# Patient Record
Sex: Female | Born: 1965 | Race: White | Hispanic: No | Smoking: Current every day smoker
Health system: Southern US, Community
[De-identification: ages and names within clinical notes are randomized; demographics above are authoritative.]

## PROBLEM LIST (undated history)

## (undated) DIAGNOSIS — M1990 Unspecified osteoarthritis, unspecified site: Secondary | ICD-10-CM

## (undated) DIAGNOSIS — I10 Essential (primary) hypertension: Secondary | ICD-10-CM

## (undated) DIAGNOSIS — F32A Depression, unspecified: Secondary | ICD-10-CM

## (undated) DIAGNOSIS — E079 Disorder of thyroid, unspecified: Secondary | ICD-10-CM

## (undated) DIAGNOSIS — J45909 Unspecified asthma, uncomplicated: Secondary | ICD-10-CM

## (undated) DIAGNOSIS — R296 Repeated falls: Secondary | ICD-10-CM

## (undated) DIAGNOSIS — G450 Vertebro-basilar artery syndrome: Secondary | ICD-10-CM

## (undated) DIAGNOSIS — I639 Cerebral infarction, unspecified: Secondary | ICD-10-CM

## (undated) DIAGNOSIS — E039 Hypothyroidism, unspecified: Secondary | ICD-10-CM

## (undated) DIAGNOSIS — F419 Anxiety disorder, unspecified: Secondary | ICD-10-CM

## (undated) DIAGNOSIS — J449 Chronic obstructive pulmonary disease, unspecified: Secondary | ICD-10-CM

## (undated) DIAGNOSIS — F17200 Nicotine dependence, unspecified, uncomplicated: Secondary | ICD-10-CM

## (undated) HISTORY — PX: TONSILLECTOMY: SUR1361

## (undated) HISTORY — DX: Nicotine dependence, unspecified, uncomplicated: F17.200

## (undated) HISTORY — PX: SHOULDER ARTHROSCOPY W/ ACROMIAL REPAIR: SUR94

## (undated) HISTORY — DX: Anxiety disorder, unspecified: F41.9

## (undated) HISTORY — PX: OTHER SURGICAL HISTORY: SHX169

## (undated) HISTORY — PX: ABDOMINAL HYSTERECTOMY: SHX81

## (undated) HISTORY — PX: KNEE SURGERY: SHX244

## (undated) HISTORY — DX: Depression, unspecified: F32.A

---

## 2008-04-13 ENCOUNTER — Emergency Department: Payer: Self-pay | Admitting: Emergency Medicine

## 2008-08-18 ENCOUNTER — Ambulatory Visit: Payer: Self-pay | Admitting: Nurse Practitioner

## 2009-04-24 ENCOUNTER — Ambulatory Visit: Payer: Self-pay | Admitting: Internal Medicine

## 2009-06-18 ENCOUNTER — Ambulatory Visit: Payer: Self-pay | Admitting: Otolaryngology

## 2009-12-26 ENCOUNTER — Ambulatory Visit: Payer: Self-pay | Admitting: Unknown Physician Specialty

## 2010-08-03 ENCOUNTER — Ambulatory Visit: Payer: Self-pay | Admitting: Family Medicine

## 2011-08-05 ENCOUNTER — Emergency Department: Payer: Self-pay | Admitting: Unknown Physician Specialty

## 2011-08-05 LAB — CBC
HCT: 34.3 % — ABNORMAL LOW (ref 35.0–47.0)
HGB: 11.5 g/dL — ABNORMAL LOW (ref 12.0–16.0)
MCH: 28.8 pg (ref 26.0–34.0)
MCHC: 33.6 g/dL (ref 32.0–36.0)
Platelet: 162 10*3/uL (ref 150–440)
RBC: 3.99 10*6/uL (ref 3.80–5.20)
WBC: 10.5 10*3/uL (ref 3.6–11.0)

## 2011-08-05 LAB — COMPREHENSIVE METABOLIC PANEL
Chloride: 108 mmol/L — ABNORMAL HIGH (ref 98–107)
Co2: 23 mmol/L (ref 21–32)
EGFR (Non-African Amer.): >60
SGOT(AST): 16 U/L (ref 15–37)
SGPT (ALT): 30 U/L
Sodium: 142 mmol/L (ref 136–145)

## 2011-08-05 LAB — TROPONIN I: Troponin-I: <0.02 ng/mL

## 2012-03-03 ENCOUNTER — Ambulatory Visit: Payer: Self-pay | Admitting: Internal Medicine

## 2013-11-26 ENCOUNTER — Ambulatory Visit: Payer: Self-pay | Admitting: Family Medicine

## 2013-11-26 LAB — RAPID STREP-A WITH REFLX: Micro Text Report: NEGATIVE

## 2013-11-29 LAB — BETA STREP CULTURE(ARMC)

## 2014-12-08 DIAGNOSIS — G450 Vertebro-basilar artery syndrome: Secondary | ICD-10-CM | POA: Insufficient documentation

## 2015-09-30 ENCOUNTER — Encounter: Payer: Self-pay | Admitting: Emergency Medicine

## 2015-09-30 ENCOUNTER — Emergency Department: Payer: Managed Care, Other (non HMO)

## 2015-09-30 ENCOUNTER — Emergency Department
Admission: EM | Admit: 2015-09-30 | Discharge: 2015-09-30 | Disposition: A | Discharge status: Home / Self Care | Payer: Managed Care, Other (non HMO) | Attending: Emergency Medicine | Admitting: Emergency Medicine

## 2015-09-30 ENCOUNTER — Ambulatory Visit
Admission: EM | Admit: 2015-09-30 | Discharge: 2015-09-30 | Disposition: A | Discharge status: Home / Self Care | Payer: Managed Care, Other (non HMO) | Attending: Family Medicine | Admitting: Family Medicine

## 2015-09-30 ENCOUNTER — Ambulatory Visit (INDEPENDENT_AMBULATORY_CARE_PROVIDER_SITE_OTHER): Payer: Managed Care, Other (non HMO)

## 2015-09-30 DIAGNOSIS — Z7951 Long term (current) use of inhaled steroids: Secondary | ICD-10-CM | POA: Insufficient documentation

## 2015-09-30 DIAGNOSIS — R0781 Pleurodynia: Secondary | ICD-10-CM

## 2015-09-30 DIAGNOSIS — J9811 Atelectasis: Secondary | ICD-10-CM | POA: Diagnosis not present

## 2015-09-30 DIAGNOSIS — M79661 Pain in right lower leg: Secondary | ICD-10-CM | POA: Diagnosis not present

## 2015-09-30 DIAGNOSIS — M62838 Other muscle spasm: Secondary | ICD-10-CM | POA: Insufficient documentation

## 2015-09-30 DIAGNOSIS — R091 Pleurisy: Secondary | ICD-10-CM | POA: Diagnosis not present

## 2015-09-30 DIAGNOSIS — Z87891 Personal history of nicotine dependence: Secondary | ICD-10-CM | POA: Diagnosis not present

## 2015-09-30 DIAGNOSIS — J189 Pneumonia, unspecified organism: Secondary | ICD-10-CM | POA: Diagnosis not present

## 2015-09-30 DIAGNOSIS — J45909 Unspecified asthma, uncomplicated: Secondary | ICD-10-CM | POA: Diagnosis not present

## 2015-09-30 DIAGNOSIS — R0602 Shortness of breath: Secondary | ICD-10-CM | POA: Diagnosis present

## 2015-09-30 HISTORY — DX: Unspecified asthma, uncomplicated: J45.909

## 2015-09-30 HISTORY — DX: Disorder of thyroid, unspecified: E07.9

## 2015-09-30 LAB — COMPREHENSIVE METABOLIC PANEL
ALK PHOS: 59 U/L (ref 38–126)
ALT: 30 U/L (ref 14–54)
ANION GAP: 6 (ref 5–15)
AST: 19 U/L (ref 15–41)
Albumin: 4.4 g/dL (ref 3.5–5.0)
BILIRUBIN TOTAL: 0.6 mg/dL (ref 0.3–1.2)
BUN: 23 mg/dL — ABNORMAL HIGH (ref 6–20)
CALCIUM: 9 mg/dL (ref 8.9–10.3)
CO2: 23 mmol/L (ref 22–32)
Chloride: 106 mmol/L (ref 101–111)
Creatinine, Ser: 0.78 mg/dL (ref 0.44–1.00)
Glucose, Bld: 94 mg/dL (ref 65–99)
Potassium: 3.5 mmol/L (ref 3.5–5.1)
Sodium: 135 mmol/L (ref 135–145)
TOTAL PROTEIN: 7.4 g/dL (ref 6.5–8.1)

## 2015-09-30 LAB — TROPONIN I

## 2015-09-30 LAB — CBC
HEMATOCRIT: 40.1 % (ref 35.0–47.0)
HEMOGLOBIN: 13.6 g/dL (ref 12.0–16.0)
MCH: 31 pg (ref 26.0–34.0)
MCHC: 33.9 g/dL (ref 32.0–36.0)
MCV: 91.6 fL (ref 80.0–100.0)
Platelets: 155 10*3/uL (ref 150–440)
RBC: 4.38 MIL/uL (ref 3.80–5.20)
RDW: 12.9 % (ref 11.5–14.5)
WBC: 12.5 10*3/uL — ABNORMAL HIGH (ref 3.6–11.0)

## 2015-09-30 MED ORDER — CARISOPRODOL 350 MG PO TABS: 350.0000 mg | ORAL_TABLET | Freq: Three times a day (TID) | ORAL | Status: DC | PRN

## 2015-09-30 MED ORDER — DOXYCYCLINE HYCLATE 100 MG PO TABS
100.0000 mg | ORAL_TABLET | Freq: Once | ORAL | Status: AC
  Administered 2015-09-30: 100 mg via ORAL
  Filled 2015-09-30: qty 1

## 2015-09-30 MED ORDER — IOPAMIDOL (ISOVUE-370) INJECTION 76%
100.0000 mL | Freq: Once | INTRAVENOUS | Status: AC | PRN
  Administered 2015-09-30: 100 mL via INTRAVENOUS

## 2015-09-30 MED ORDER — KETOROLAC TROMETHAMINE 30 MG/ML IJ SOLN
30.0000 mg | Freq: Once | INTRAMUSCULAR | Status: AC
  Administered 2015-09-30: 30 mg via INTRAVENOUS
  Filled 2015-09-30: qty 1

## 2015-09-30 MED ORDER — DOXYCYCLINE HYCLATE 100 MG PO CAPS
100.0000 mg | ORAL_CAPSULE | Freq: Two times a day (BID) | ORAL | Status: DC
Start: 2015-09-30 — End: 2019-03-17

## 2015-09-30 NOTE — ED Provider Notes (Signed)
[facility] Emergency Department Provider Note  ____________________________________________  Time seen: Approximately 1020 PM  I have reviewed the triage vital signs and the nursing notes.   HISTORY  Chief Complaint Shortness of Breath    HPI Cindy Grant is a 50 y.o. female with a history of asthma and thyroid disease was presenting to the emergency department today with 3 days of left-sided chest pain which she describes as a spasm as well as shortness of breath. She denies any cough or fever. Says that she was also having pain 3 days ago behind her right knee which is now resolved. She says the pain felt like a spasm and is severe and intermittent. It is lasting anywhere from 30 seconds to 1 minute at a time. It is worsened with deep breaths. She was seen in urgent care earlier today and sent in to the emergency department for evaluation for PE. She is not taken any medication for pain. She denies any heavy lifting or any possibility she could've injured herself.She said that she had flu about a month ago but this has since resolved.   Past Medical History  Diagnosis Date  . Asthma   . Thyroid disease     There are no active problems to display for this patient.   Past Surgical History  Procedure Laterality Date  . Abdominal hysterectomy    . Tonsillectomy      Current Outpatient Rx  Name  Route  Sig  Dispense  Refill  . fluticasone-salmeterol (ADVAIR HFA) 115-21 MCG/ACT inhaler   Inhalation   Inhale 2 puffs into the lungs 2 (two) times daily.         Marland Kitchen levothyroxine (SYNTHROID, LEVOTHROID) 150 MCG tablet   Oral   Take 150 mcg by mouth daily before breakfast.         . PARoxetine (PAXIL) 40 MG tablet   Oral   Take 40 mg by mouth every morning.           Allergies Hydrocodone; Oxycodone; and Penicillins  No family history on file.  Social History Social History  Substance Use Topics  . Smoking status: Former Games developer  .  Smokeless tobacco: None  . Alcohol Use: Yes    Review of Systems Constitutional: No fever/chills Eyes: No visual changes. ENT: No sore throat. Cardiovascular: As above Respiratory: As above Gastrointestinal: No abdominal pain.  No nausea, no vomiting.  No diarrhea.  No constipation. Genitourinary: Negative for dysuria. Musculoskeletal: Negative for back pain. Skin: Negative for rash. Neurological: Negative for headaches, focal weakness or numbness.  10-point ROS otherwise negative.  ____________________________________________   PHYSICAL EXAM:  VITAL SIGNS: ED Triage Vitals  Enc Vitals Group     BP 09/30/15 2033 130/62 mmHg     Pulse Rate 09/30/15 2211 73     Resp 09/30/15 2033 20     Temp 09/30/15 2049 98.4 F (36.9 C)     Temp Source 09/30/15 2049 Oral     SpO2 09/30/15 2033 97 %     Weight 09/30/15 2033 230 lb (104.327 kg)     Height --      Head Cir --      Peak Flow --      Pain Score 09/30/15 2030 8     Pain Loc --      Pain Edu? --      Excl. in GC? --     Constitutional: Alert and oriented. Well appearing and in no acute distress.In contrast  to the nursing notes from triage, the patient is not obviously short of breath. However, she does of intermittent spasms which cause her to have heaving respirations. In between the spasms to the left thorax she is calm, relaxed and speaks in full sentences without any respiratory distress. Eyes: Conjunctivae are normal. PERRL. EOMI. Head: Atraumatic. Nose: No congestion/rhinnorhea. Mouth/Throat: Mucous membranes are moist.   Neck: No stridor.   Cardiovascular: Normal rate, regular rhythm. Grossly normal heart sounds.  Good peripheral circulation. Mild tenderness to palpation to left lower lateral thorax. No deformity. No crepitus. Respiratory: Normal respiratory effort.  No retractions. Lungs CTAB. Gastrointestinal: Soft and nontender. No distention.  Musculoskeletal: No lower extremity tenderness nor edema.  No joint  effusions. No tenderness to palpation or masses or ropelike structures to the popliteal fossa bilaterally. Neurologic:  Normal speech and language. No gross focal neurologic deficits are appreciated. No gait instability. Skin:  Skin is warm, dry and intact. No rash noted. Psychiatric: Mood and affect are normal. Speech and behavior are normal.  ____________________________________________   LABS (all labs ordered are listed, but only abnormal results are displayed)  Labs Reviewed  CBC - Abnormal; Notable for the following:    WBC 12.5 (*)    All other components within normal limits  COMPREHENSIVE METABOLIC PANEL - Abnormal; Notable for the following:    BUN 23 (*)    All other components within normal limits  TROPONIN I   ____________________________________________  EKG  ED ECG REPORT I, Arelia LongestSchaevitz,  Cyndie Woodbeck M, the attending physician, personally viewed and interpreted this ECG.   Date: 09/30/2015  EKG Time: 2043  Rate: 69  Rhythm: normal sinus rhythm  Axis: Normal  Intervals: Normal  ST&T Change: No ST segment elevation or depression. No abnormal T-wave inversions. Nonspecific T-wave abnormality likely read by machine because of poor baseline in 1, 3 and aVL.  ____________________________________________  RADIOLOGY      CT Angio Chest PE W/Cm &/Or Wo Cm (Final result) Result time: 09/30/15 22:14:21   Final result by Rad Results In Interface (09/30/15 22:14:21)   Narrative:   CLINICAL DATA: Left-sided pleuritic chest pain and shortness of breath.  EXAM: CT ANGIOGRAPHY CHEST WITH CONTRAST  TECHNIQUE: Multidetector CT imaging of the chest was performed using the standard protocol during bolus administration of intravenous contrast. Multiplanar CT image reconstructions and MIPs were obtained to evaluate the vascular anatomy.  CONTRAST: 100 mL Isovue 370 IV  COMPARISON: Chest x-ray today.  FINDINGS: The pulmonary arteries are well opacified. There is no  evidence of pulmonary embolism. The thoracic aorta is also well opacified and shows normal caliber without evidence of aneurysm or dissection.  Parenchymal opacity at the left lung base present. This most likely represents atelectasis. Early infiltrate is not excluded. There is associated small left pleural effusion. No pulmonary edema, nodules or pneumothorax. No lymphadenopathy identified. The heart size is normal. No pericardial effusion. Visualized upper abdomen shows diffuse steatosis of the liver.  Review of the MIP images confirms the above findings.  IMPRESSION: 1. No evidence of pulmonary embolism. 2. Left lower lobe atelectasis versus early infiltrate with associated small left pleural effusion. 3. Diffuse hepatic steatosis.   Electronically Signed By: Irish LackGlenn Yamagata M.D. On: 09/30/2015 22:14    ____________________________________________   PROCEDURES  ____________________________________________   INITIAL IMPRESSION / ASSESSMENT AND PLAN / ED COURSE  Pertinent labs & imaging results that were available during my care of the patient were reviewed by me and considered in my medical decision making (  see chart for details).  ----------------------------------------- 10:58 PM on 09/30/2015 -----------------------------------------  We'll give the patient dose of Toradol prior to discharge.  After seeing patient clinically she is having chest wall spasm causing the majority of her pain. However, does have left lower lobe atelectasis versus early infiltrate with a small pleural effusion. We'll treat for pneumonia. Discussed the lab results as well as imaging with the patient. She understands the plan is going to comply. She'll also be trying muscle cream such as Aspercreme or icy hot at home for pain relief. ____________________________________________   FINAL CLINICAL IMPRESSION(S) / ED DIAGNOSES  Chest wall pain. Pleurisy. Left lower lobe CAP.     Myrna Blazer, MD 09/30/15 [phone]

## 2015-09-30 NOTE — ED Notes (Signed)
Patient c/o left sided rib pain for the past 3 days.  Patient reports pain in worse with walking and with movement.  Patient reports pain is worse when she takes a deep breath.

## 2015-09-30 NOTE — ED Notes (Signed)
Patient reports shortness of breath and left sided rib pain x3 days. Patient visibly short of breath with talking. Patient was sent from acute care for r/o PE. Patient denies any recent travel, former but not current smoker. States she developed pain to back of right knee a few days ago that was severe and throbbing. Patient ambulatory to stat desk, but obviously short of breath.

## 2015-09-30 NOTE — ED Provider Notes (Signed)
CSN: [id number]     Arrival date & time 09/30/15  1641 History   First MD Initiated Contact with Patient 09/30/15 1851     Chief Complaint  Patient presents with  . Rib Pain    (Consider location/radiation/quality/duration/timing/severity/associated sxs/prior Treatment) HPI Comments: 50 yo female with a 3 days h/o left sided lower chest/rib pain worse with deep breaths. Patient denies any trauma, injuries, falls, fevers, chills, shortness of breath. Also reports about 4 days of right lower leg pain in the upper calf and behind the knee area. Denies any trauma or injury to her right leg. Patient denies rash, recent surgeries, prolonged immobilization, hormonal treatment, or known family h/o blood clots or PE.   The history is provided by the patient.    Past Medical History  Diagnosis Date  . Asthma   . Thyroid disease    Past Surgical History  Procedure Laterality Date  . Abdominal hysterectomy    . Tonsillectomy     History reviewed. No pertinent family history. Social History  Substance Use Topics  . Smoking status: Former Games developer  . Smokeless tobacco: None  . Alcohol Use: Yes   OB History    No data available     Review of Systems  Allergies  Hydrocodone; Oxycodone; and Penicillins  Home Medications   Prior to Admission medications   Medication Sig Start Date End Date Taking? Authorizing Provider  fluticasone-salmeterol (ADVAIR HFA) 115-21 MCG/ACT inhaler Inhale 2 puffs into the lungs 2 (two) times daily.   Yes Historical Provider, MD  levothyroxine (SYNTHROID, LEVOTHROID) 150 MCG tablet Take 150 mcg by mouth daily before breakfast.   Yes Historical Provider, MD  PARoxetine (PAXIL) 40 MG tablet Take 40 mg by mouth every morning.   Yes Historical Provider, MD   Meds Ordered and Administered this Visit  Medications - No data to display  BP 131/63 mmHg  Pulse 81  Temp(Src) 97.5 F (36.4 C) (Tympanic)  Resp 17  Ht  (1.651 m)  Wt 230 lb (104.327 kg)  BMI  38.27 kg/m2  SpO2 100% No data found.   Physical Exam  Constitutional: She appears well-developed and well-nourished. No distress.  Neck: Normal range of motion. Neck supple. No JVD present. No tracheal deviation present.  Cardiovascular: Normal rate, regular rhythm, normal heart sounds and intact distal pulses.   No murmur heard. Pulmonary/Chest: Effort normal and breath sounds normal. No stridor. No respiratory distress. She has no wheezes. She has no rales. She exhibits no tenderness.  Musculoskeletal: She exhibits tenderness. She exhibits no edema.       Right lower leg: She exhibits tenderness (to right upper calf area).  Neurological: She is alert.  Skin: She is not diaphoretic.  Nursing note and vitals reviewed.   ED Course  Procedures (including critical care time)  Labs Review Labs Reviewed - No data to display  Imaging Review Dg Ribs Unilateral W/chest Left  09/30/2015  CLINICAL DATA:  Left-sided rib pain for 3 days. EXAM: LEFT RIBS AND CHEST - 3+ VIEW COMPARISON:  Chest x-ray dated 08/05/2011 FINDINGS: No fracture or other bone lesions are seen involving the ribs. Small focal area of atelectasis at the left base laterally with a tiny left effusion. Heart size and pulmonary vascularity are normal. Right lung is clear. IMPRESSION: Focal atelectasis and small effusion at the left lung base. The ribs appear normal. Electronically Signed   By: Francene Boyers M.D.   On: 09/30/2015 19:22     Visual Acuity Review  Right Eye Distance:   Left Eye Distance:   Bilateral Distance:    Right Eye Near:   Left Eye Near:    Bilateral Near:         MDM   1. Pleuritic chest pain   2. Atelectasis   3. Right calf pain      1. x-ray results and possible etiology/diagnosis reviewed with patient; recommend patient go to ED for further evaluation (for possible PE) and management; patient verbalizes understanding; patient in stable condition; [facility] in  [facility]; [facility]            Discharge Medication List as of 09/30/2015  8:04 PM       Payton Mccallumrlando Cecille Mcclusky, MD 09/30/15 2018

## 2015-09-30 NOTE — ED Notes (Addendum)
Pt reports spasm in left rib cage with deep breaths - Spasm/pain radiates into mid sternal area - Pt denies vomiting but reports nausea with the spasms - pt went to urgent care today and they sent her to the er stating that they thought she might have a PE

## 2015-09-30 NOTE — Discharge Instructions (Signed)

## 2017-04-10 IMAGING — CT CT ANGIO CHEST
2 of 6 series · 18 of 36 positions shown · IV contrast (APPLIED)
Comparison: Chest x-ray today.

CLINICAL DATA: Left-sided pleuritic chest pain and shortness of
breath.

EXAM:
CT ANGIOGRAPHY CHEST WITH CONTRAST
TECHNIQUE: Multidetector CT imaging of the chest was performed using the
standard protocol during bolus administration of intravenous
contrast. Multiplanar CT image reconstructions and MIPs were
obtained to evaluate the vascular anatomy.
CONTRAST:  100 mL Isovue 370 IV

[Series 5: pe 1.0 thins · axial · 0.70mm/px · z∈[-303,-73]mm · 17 of 260 slices shown]
[im 15/260  lung]
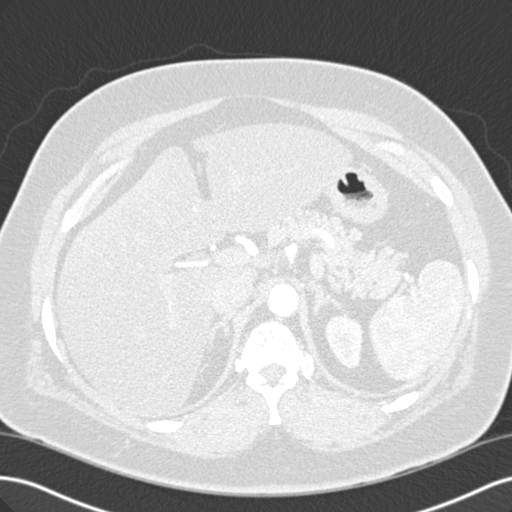
[im 29/260  mediastinal]
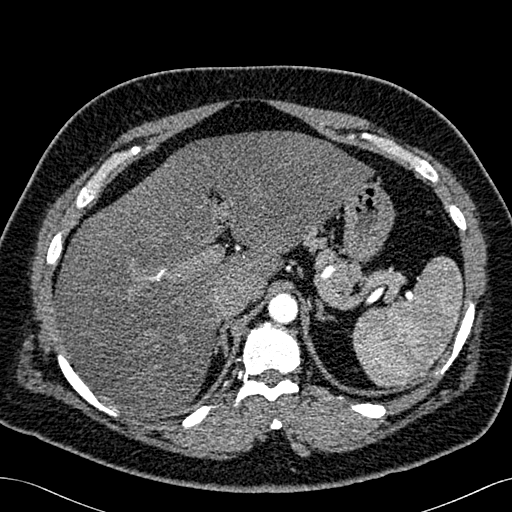
[im 44/260  lung]
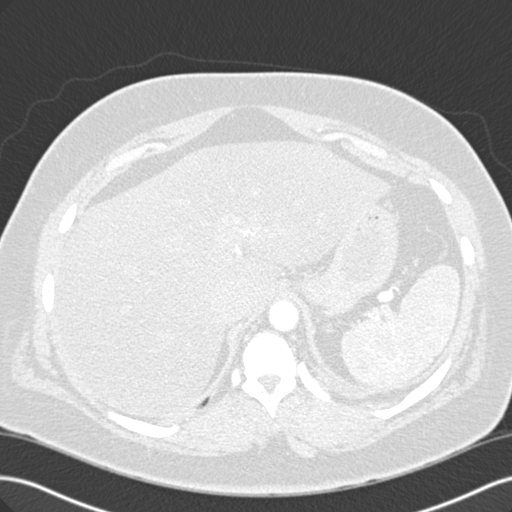
[im 58/260  mediastinal]
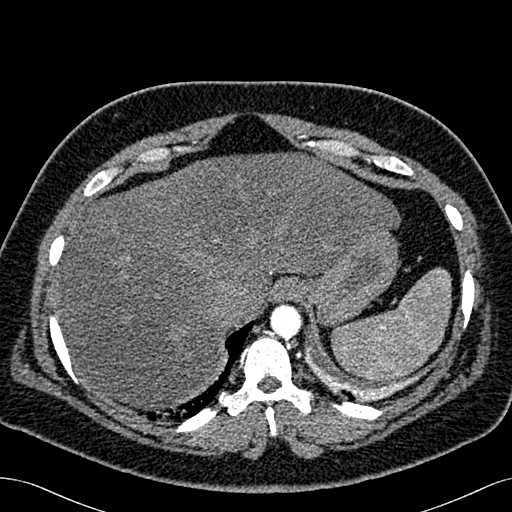
[im 72/260  lung]
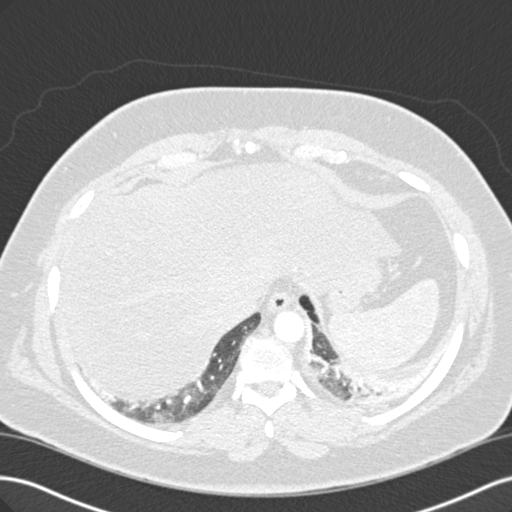
[im 87/260  mediastinal]
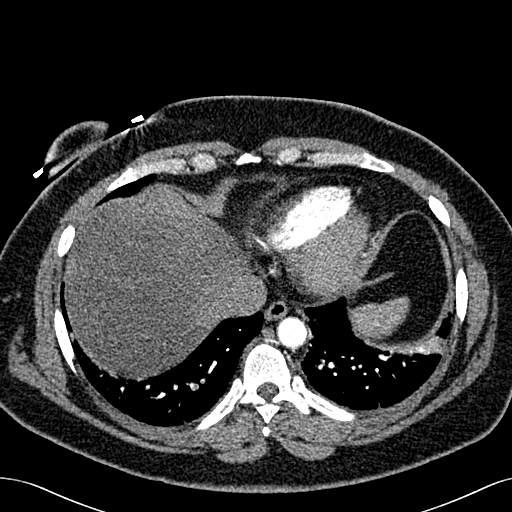
[im 101/260  lung]
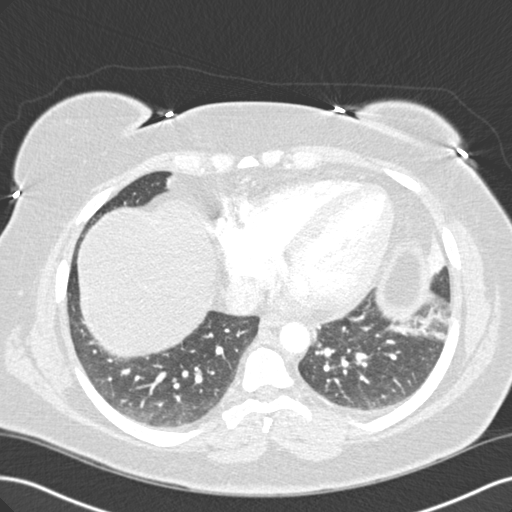
[im 116/260  mediastinal]
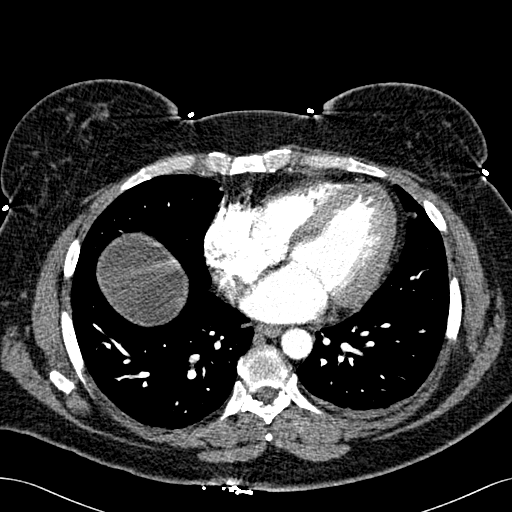
[im 130/260  lung]
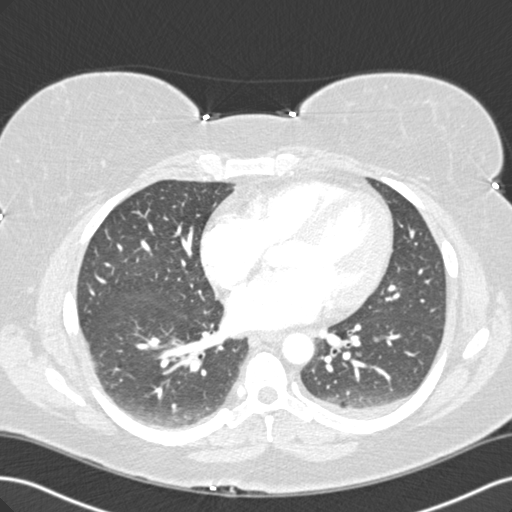
[im 144/260  mediastinal]
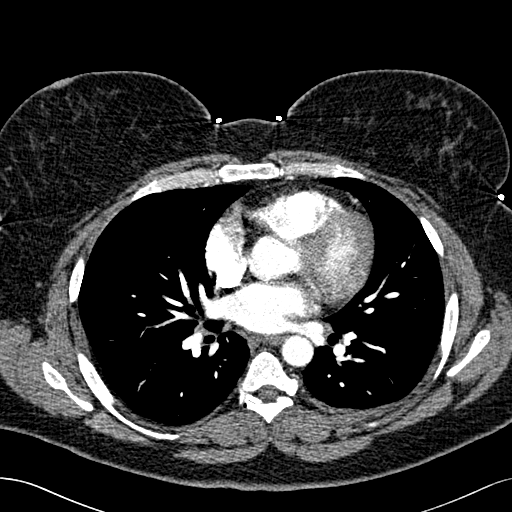
[im 159/260  lung]
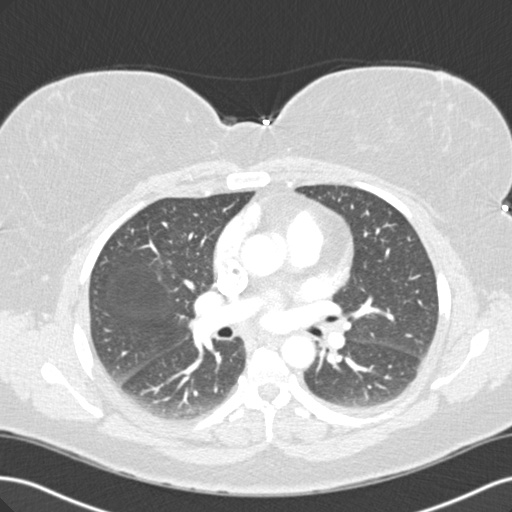
[im 173/260  mediastinal]
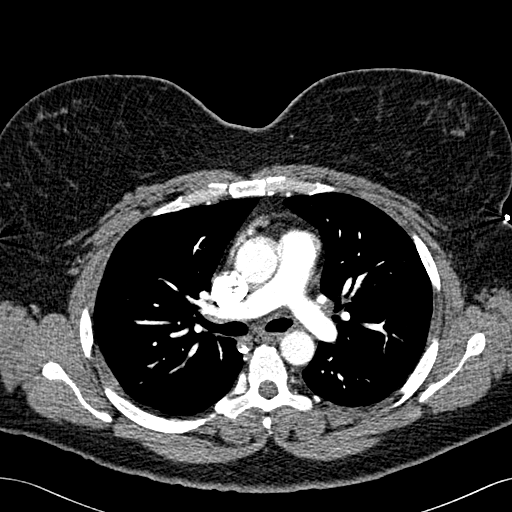
[im 188/260  lung]
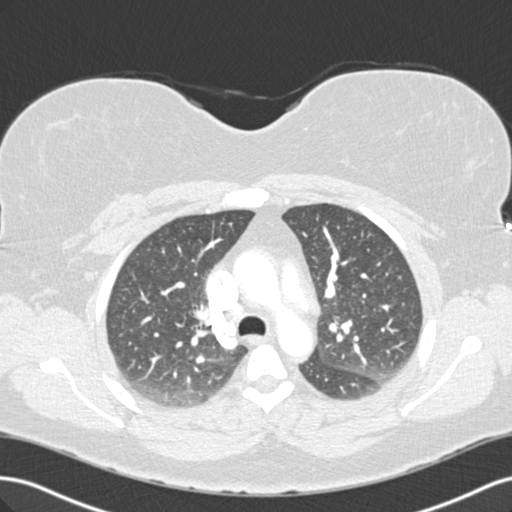
[im 202/260  mediastinal]
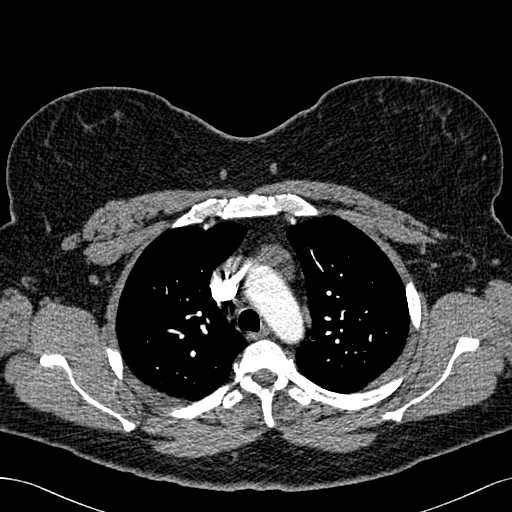
[im 216/260  lung]
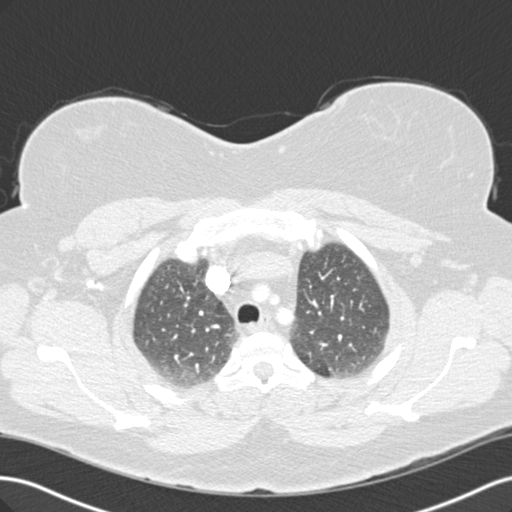
[im 231/260  mediastinal]
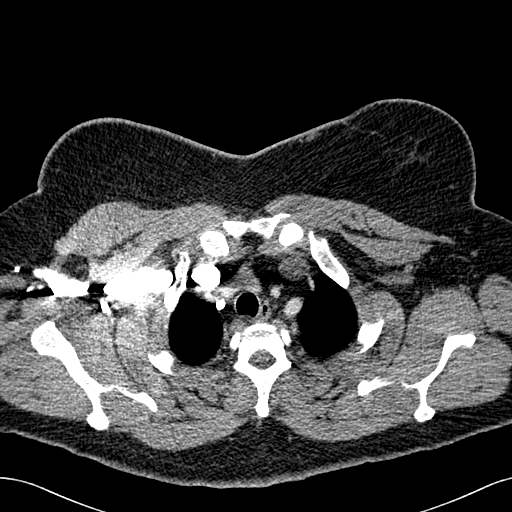
[im 245/260  lung]
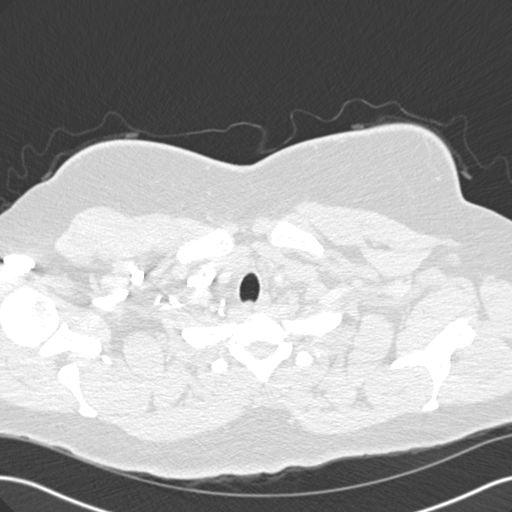

[Series 7: cor pe 2.0 mpr · coronal · 0.64mm/px · 1 of 137 slices shown]
[im 69/137  mediastinal]
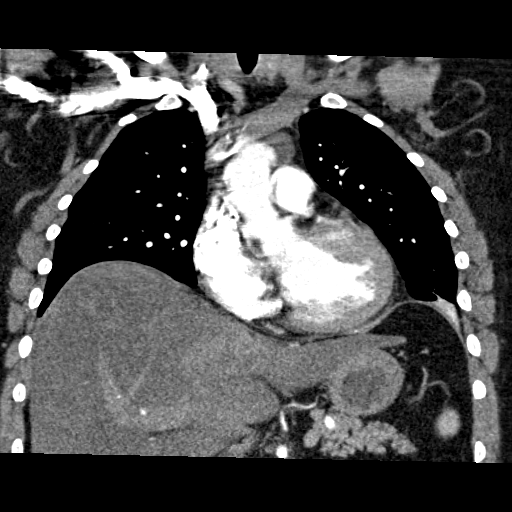

[18 of 36 positions shown; findings below may reference images not displayed]

FINDINGS: The pulmonary arteries are well opacified. There is no evidence of
pulmonary embolism. The thoracic aorta is also well opacified and
shows normal caliber without evidence of aneurysm or dissection.

Parenchymal opacity at the left lung base present. This most likely
represents atelectasis. Early infiltrate is not excluded. There is
associated small left pleural effusion. No pulmonary edema, nodules
or pneumothorax. No lymphadenopathy identified. The heart size is
normal. No pericardial effusion. Visualized upper abdomen shows
diffuse steatosis of the liver.

Review of the MIP images confirms the above findings.
IMPRESSION: 1. No evidence of pulmonary embolism.
2. Left lower lobe atelectasis versus early infiltrate with
associated small left pleural effusion.
3. Diffuse hepatic steatosis.

## 2017-04-10 IMAGING — CR DG RIBS W/ CHEST 3+V*L*
5 series · 5 of 5 positions shown · non-contrast
Comparison: Chest x-ray dated 08/05/2011

CLINICAL DATA: Left-sided rib pain for 3 days.

EXAM:
LEFT RIBS AND CHEST - 3+ VIEW

[chest pa]
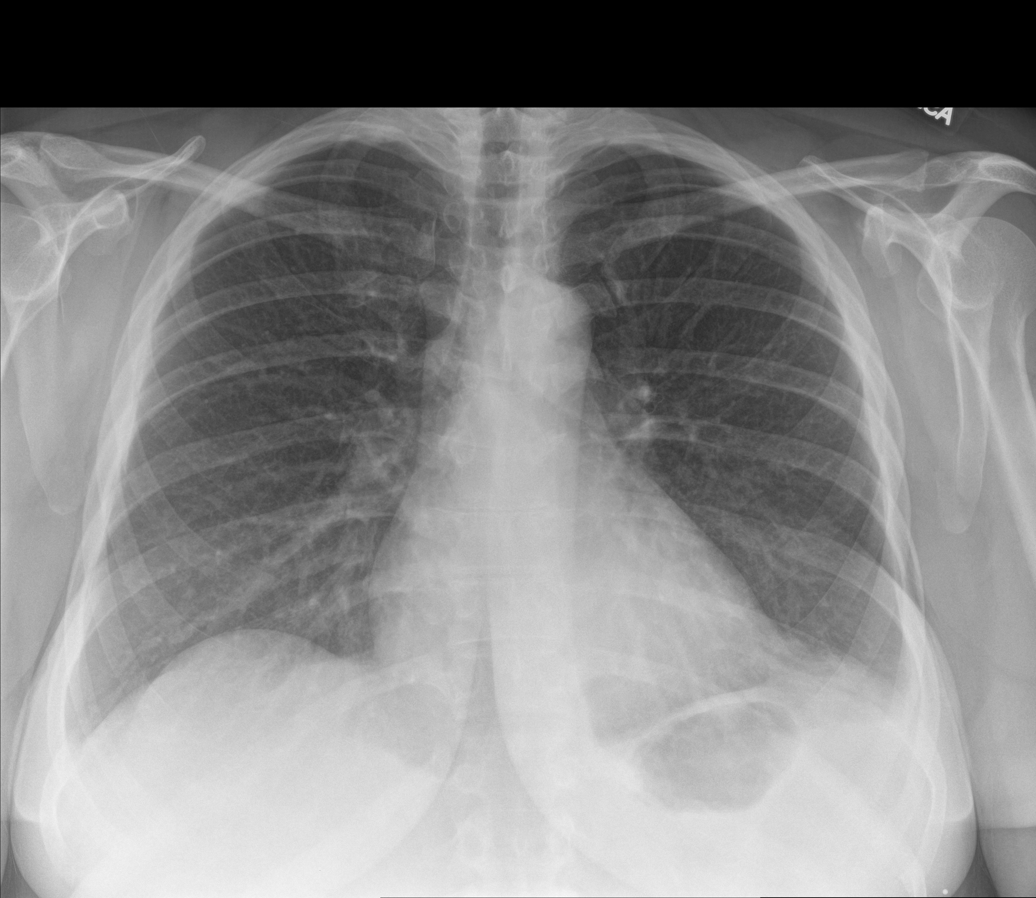

[rib pa (1 of 2)]
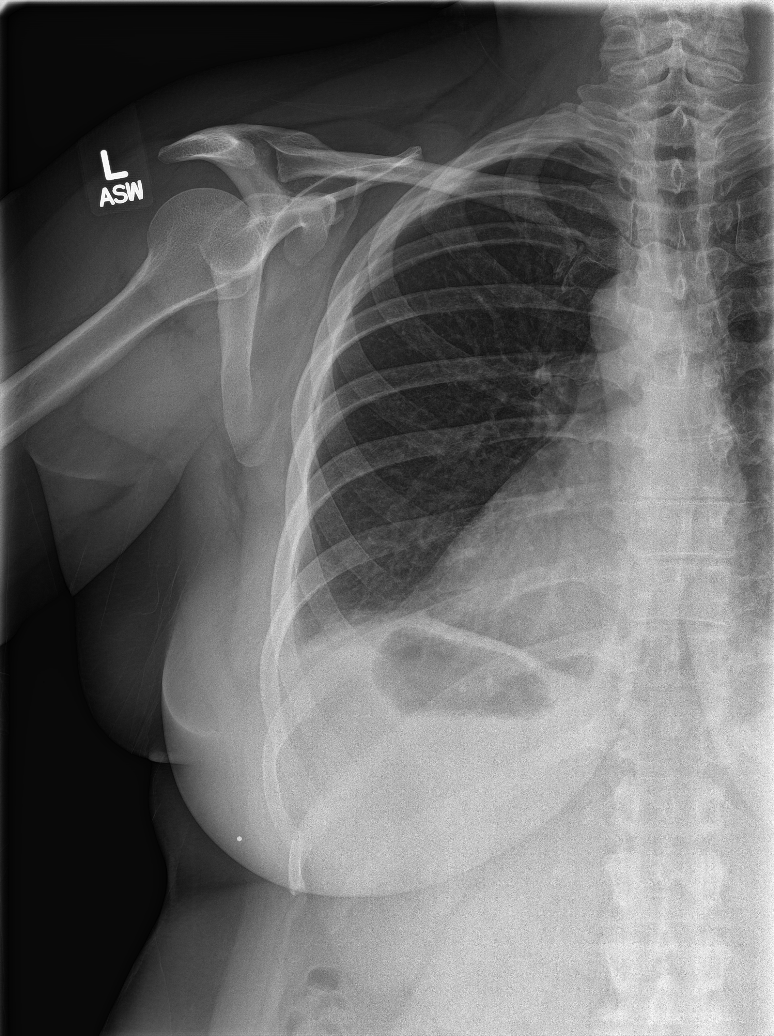

[rib pa (2 of 2)]
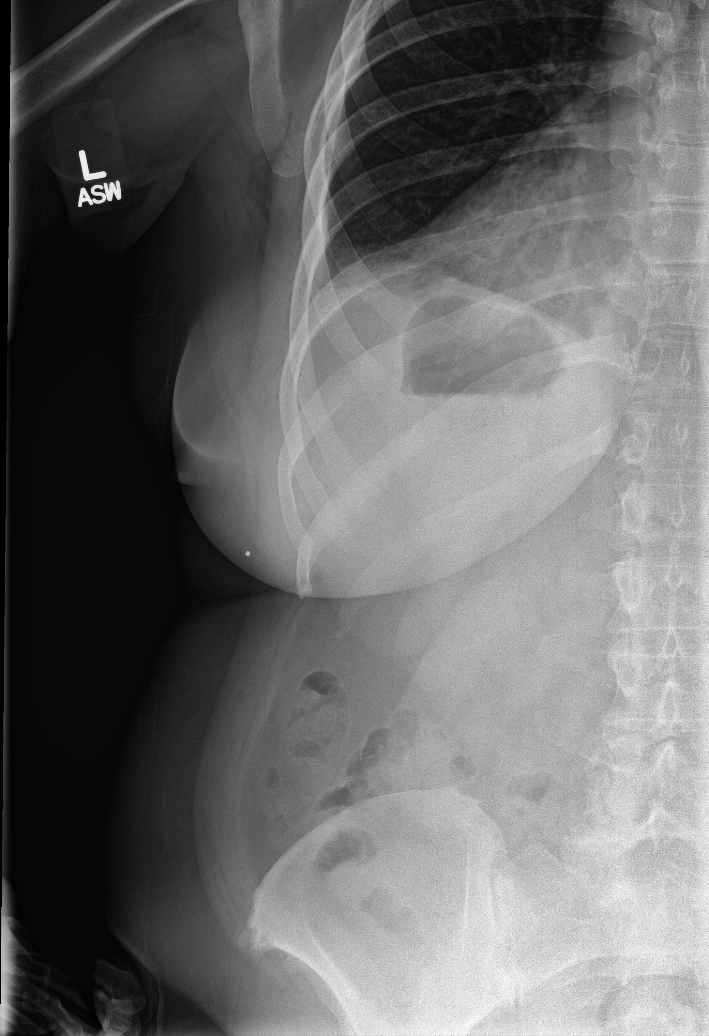

[rib obl (1 of 2)]
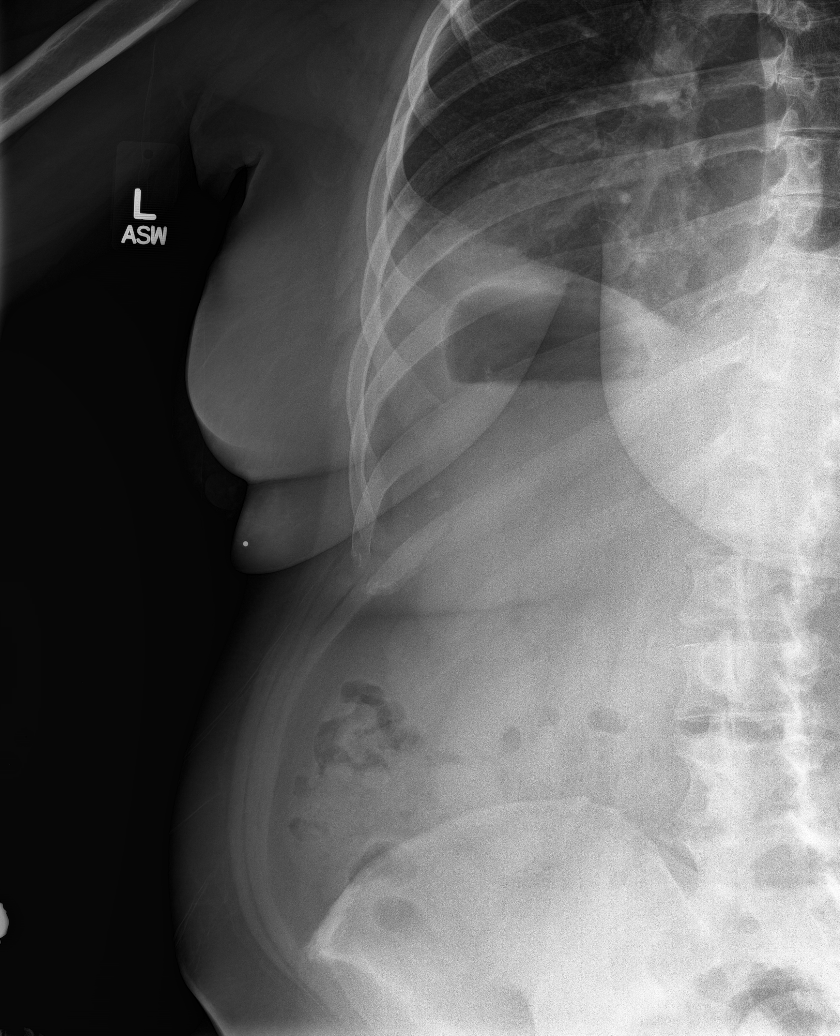

[rib obl (2 of 2)]
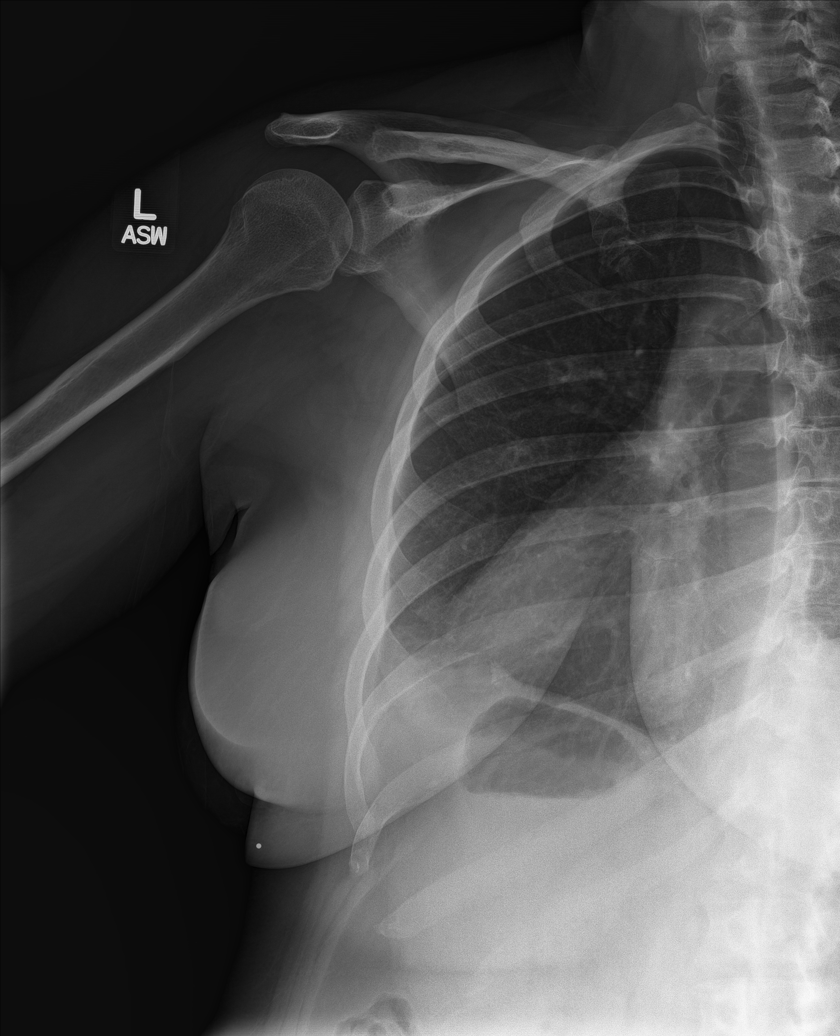

[5 of 5 positions shown; findings below may reference images not displayed]

FINDINGS: No fracture or other bone lesions are seen involving the ribs. Small
focal area of atelectasis at the left base laterally with a tiny
left effusion. Heart size and pulmonary vascularity are normal.
Right lung is clear.
IMPRESSION: Focal atelectasis and small effusion at the left lung base. The ribs
appear normal.

## 2017-04-24 DIAGNOSIS — I10 Essential (primary) hypertension: Secondary | ICD-10-CM | POA: Insufficient documentation

## 2017-04-24 DIAGNOSIS — R490 Dysphonia: Secondary | ICD-10-CM | POA: Insufficient documentation

## 2017-04-24 DIAGNOSIS — K429 Umbilical hernia without obstruction or gangrene: Secondary | ICD-10-CM | POA: Insufficient documentation

## 2017-04-24 DIAGNOSIS — Z8249 Family history of ischemic heart disease and other diseases of the circulatory system: Secondary | ICD-10-CM | POA: Insufficient documentation

## 2017-04-24 DIAGNOSIS — E039 Hypothyroidism, unspecified: Secondary | ICD-10-CM | POA: Insufficient documentation

## 2017-04-24 DIAGNOSIS — F418 Other specified anxiety disorders: Secondary | ICD-10-CM | POA: Insufficient documentation

## 2017-04-24 DIAGNOSIS — Z833 Family history of diabetes mellitus: Secondary | ICD-10-CM | POA: Insufficient documentation

## 2017-04-24 DIAGNOSIS — Z90710 Acquired absence of both cervix and uterus: Secondary | ICD-10-CM | POA: Insufficient documentation

## 2017-05-04 DIAGNOSIS — Z9889 Other specified postprocedural states: Secondary | ICD-10-CM | POA: Insufficient documentation

## 2017-09-19 ENCOUNTER — Encounter: Payer: Self-pay | Admitting: Emergency Medicine

## 2017-09-19 ENCOUNTER — Other Ambulatory Visit: Payer: Self-pay

## 2017-09-19 DIAGNOSIS — Z87891 Personal history of nicotine dependence: Secondary | ICD-10-CM | POA: Diagnosis not present

## 2017-09-19 DIAGNOSIS — K0889 Other specified disorders of teeth and supporting structures: Secondary | ICD-10-CM | POA: Diagnosis present

## 2017-09-19 DIAGNOSIS — J45909 Unspecified asthma, uncomplicated: Secondary | ICD-10-CM | POA: Insufficient documentation

## 2017-09-19 DIAGNOSIS — K029 Dental caries, unspecified: Secondary | ICD-10-CM | POA: Diagnosis not present

## 2017-09-19 NOTE — ED Triage Notes (Signed)
Patient ambulatory to triage with steady gait, without difficulty or distress noted; pt reports right sided dental pain 

## 2017-09-20 ENCOUNTER — Emergency Department
Admission: EM | Admit: 2017-09-20 | Discharge: 2017-09-20 | Disposition: A | Discharge status: Home / Self Care | Payer: 59 | Attending: Emergency Medicine | Admitting: Emergency Medicine

## 2017-09-20 DIAGNOSIS — K029 Dental caries, unspecified: Secondary | ICD-10-CM

## 2017-09-20 MED ORDER — KETOROLAC TROMETHAMINE 10 MG PO TABS
10.0000 mg | ORAL_TABLET | Freq: Once | ORAL | Status: AC
  Administered 2017-09-20: 10 mg via ORAL
  Filled 2017-09-20: qty 1

## 2017-09-20 MED ORDER — LIDOCAINE VISCOUS 2 % MT SOLN
15.0000 mL | Freq: Once | OROMUCOSAL | Status: AC
  Administered 2017-09-20: 15 mL via OROMUCOSAL
  Filled 2017-09-20: qty 15

## 2017-09-20 MED ORDER — DOXYCYCLINE HYCLATE 100 MG PO TABS
100.0000 mg | ORAL_TABLET | Freq: Once | ORAL | Status: AC
  Administered 2017-09-20: 100 mg via ORAL
  Filled 2017-09-20: qty 1

## 2017-09-20 MED ORDER — KETOROLAC TROMETHAMINE 10 MG PO TABS: 10.0000 mg | ORAL_TABLET | Freq: Four times a day (QID) | ORAL | 0 refills | Status: DC | PRN

## 2017-09-20 MED ORDER — DOXYCYCLINE HYCLATE 50 MG PO CAPS: 100.0000 mg | ORAL_CAPSULE | Freq: Two times a day (BID) | ORAL | 0 refills | Status: AC

## 2017-09-20 NOTE — ED Provider Notes (Signed)
[facility] Emergency Department Provider Note    First MD Initiated Contact with Patient 09/20/17 0330     (approximate)  I have reviewed the triage vital signs and the nursing notes.   HISTORY  Chief Complaint Dental Pain    HPI Cindy Grant is a 52 y.o. female below list of chronic medical conditions presents to the emergency department with right mandibular dental pain secondary to dental carry.  Patient states that this is been occurring for the past few months however acute worsening tonight.  Patient states that she had a filling in that tooth that came out that tooth.  Patient denies any pain at present stating current pain score 0 out of 10 however patient states that pain earlier was 10 out of 10.  Past Medical History:  Diagnosis Date  . Asthma   . Thyroid disease     There are no active problems to display for this patient.   Past Surgical History:  Procedure Laterality Date  . ABDOMINAL HYSTERECTOMY    . TONSILLECTOMY      Prior to Admission medications   Medication Sig Start Date End Date Taking? Authorizing Provider  carisoprodol (SOMA) 350 MG tablet Take 1 tablet (350 mg total) by mouth 3 (three) times daily as needed for muscle spasms. 09/30/15   Schaevitz, Myra Rude, MD  doxycycline (VIBRAMYCIN) 100 MG capsule Take 1 capsule (100 mg total) by mouth 2 (two) times daily. 09/30/15   Schaevitz, Myra Rude, MD  doxycycline (VIBRAMYCIN) 50 MG capsule Take 2 capsules (100 mg total) by mouth 2 (two) times daily for 7 days. 09/20/17 09/27/17  Darci Current, MD  fluticasone-salmeterol (ADVAIR HFA) [phone] MCG/ACT inhaler Inhale 2 puffs into the lungs 2 (two) times daily.    [provider]  ketorolac (TORADOL) 10 MG tablet Take 1 tablet (10 mg total) by mouth every 6 (six) hours as needed. 09/20/17   Darci Current, MD  levothyroxine (SYNTHROID, LEVOTHROID) 150 MCG tablet Take 150 mcg by mouth daily before breakfast.     [provider]  PARoxetine (PAXIL) 40 MG tablet Take 40 mg by mouth every morning.    [provider]    Allergies Hydrocodone; Oxycodone; and Penicillins  No family history on file.  Social History Social History   Tobacco Use  . Smoking status: Former Games developer  . Smokeless tobacco: Never Used  Substance Use Topics  . Alcohol use: Yes  . Drug use: Not on file    Review of Systems Constitutional: No fever/chills Eyes: No visual changes. ENT: No sore throat.  Positive for dental pain Cardiovascular: Denies chest pain. Respiratory: Denies shortness of breath. Gastrointestinal: No abdominal pain.  No nausea, no vomiting.  No diarrhea.  No constipation. Genitourinary: Negative for dysuria. Musculoskeletal: Negative for neck pain.  Negative for back pain. Integumentary: Negative for rash. Neurological: Negative for headaches, focal weakness or numbness.   ____________________________________________   PHYSICAL EXAM:  VITAL SIGNS: ED Triage Vitals [09/19/17 2332]  Enc Vitals Group     BP 139/77     Pulse Rate 63     Resp 20     Temp 97.6 F (36.4 C)     Temp Source Oral     SpO2 99 %     Weight 100.2 kg (221 lb)     Height 1.651 m (5\' 5" )     Head Circumference      Peak Flow      Pain Score 10  Pain Loc      Pain Edu?      Excl. in GC?     Constitutional: Alert and oriented. Well appearing and in no acute distress. Eyes: Conjunctivae are normal. Head: Atraumatic. Mouth/Throat: Mucous membranes are moist. Oropharynx non-erythematous.  2 posterior mandibular molar cavities noted on the right.   Neck: No stridor. Cardiovascular: Normal rate, regular rhythm. Good peripheral circulation. Grossly normal heart sounds. Neurologic:  Normal speech and language. No gross focal neurologic deficits are appreciated.  Skin:  Skin is warm, dry and intact. No rash noted. Psychiatric: Mood and affect are normal. Speech and behavior are  normal.      Procedures   ____________________________________________   INITIAL IMPRESSION / ASSESSMENT AND PLAN / ED COURSE  As part of my medical decision making, I reviewed the following data within the electronic MEDICAL RECORD NUMBER   52 year old female presented with above-stated history and physical exam of dental caries and dental pain.  Patient given viscous lidocaine and Toradol as well as doxycycline for home. ____________________________________________  FINAL CLINICAL IMPRESSION(S) / ED DIAGNOSES  Final diagnoses:  Dental caries     MEDICATIONS GIVEN DURING THIS VISIT:  Medications  ketorolac (TORADOL) tablet 10 mg (10 mg Oral Given 09/20/17 0345)  lidocaine (XYLOCAINE) 2 % viscous mouth solution 15 mL (15 mLs Mouth/Throat Given 09/20/17 0345)  doxycycline (VIBRA-TABS) tablet 100 mg (100 mg Oral Given 09/20/17 0345)     ED Discharge Orders        Ordered    ketorolac (TORADOL) 10 MG tablet  Every 6 hours PRN     09/20/17 0345    doxycycline (VIBRAMYCIN) 50 MG capsule  2 times daily     09/20/17 0345       Note:  This document was prepared using Dragon voice recognition software and may include unintentional dictation errors.    Darci CurrentBrown, Mullan N, MD 09/20/17 [phone]

## 2018-08-04 ENCOUNTER — Emergency Department
Admission: EM | Admit: 2018-08-04 | Discharge: 2018-08-04 | Disposition: A | Discharge status: Home / Self Care | Payer: 59 | Attending: Emergency Medicine | Admitting: Emergency Medicine

## 2018-08-04 ENCOUNTER — Emergency Department: Payer: 59

## 2018-08-04 ENCOUNTER — Other Ambulatory Visit: Payer: Self-pay

## 2018-08-04 ENCOUNTER — Encounter: Payer: Self-pay | Admitting: Emergency Medicine

## 2018-08-04 DIAGNOSIS — Z79899 Other long term (current) drug therapy: Secondary | ICD-10-CM | POA: Insufficient documentation

## 2018-08-04 DIAGNOSIS — Z87891 Personal history of nicotine dependence: Secondary | ICD-10-CM | POA: Insufficient documentation

## 2018-08-04 DIAGNOSIS — J45909 Unspecified asthma, uncomplicated: Secondary | ICD-10-CM | POA: Diagnosis not present

## 2018-08-04 DIAGNOSIS — M542 Cervicalgia: Secondary | ICD-10-CM | POA: Diagnosis present

## 2018-08-04 MED ORDER — CYCLOBENZAPRINE HCL 5 MG PO TABS: ORAL_TABLET | ORAL | 0 refills | Status: DC

## 2018-08-04 MED ORDER — NAPROXEN 250 MG PO TABS: 250.0000 mg | ORAL_TABLET | Freq: Two times a day (BID) | ORAL | 0 refills | Status: DC
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
## 2018-08-07 ENCOUNTER — Other Ambulatory Visit: Payer: Self-pay

## 2018-08-07 ENCOUNTER — Ambulatory Visit
Admission: EM | Admit: 2018-08-07 | Discharge: 2018-08-07 | Disposition: A | Discharge status: Home / Self Care | Payer: 59 | Attending: Family Medicine | Admitting: Family Medicine

## 2018-08-07 ENCOUNTER — Ambulatory Visit (INDEPENDENT_AMBULATORY_CARE_PROVIDER_SITE_OTHER): Payer: 59

## 2018-08-07 DIAGNOSIS — S8001XA Contusion of right knee, initial encounter: Secondary | ICD-10-CM

## 2018-08-07 DIAGNOSIS — M25561 Pain in right knee: Secondary | ICD-10-CM

## 2018-08-07 DIAGNOSIS — S8391XA Sprain of unspecified site of right knee, initial encounter: Secondary | ICD-10-CM
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
## 2018-08-07 NOTE — Discharge Instructions (Signed)
Rest, ice, over the counter tylenol/ibuprofen 

## 2018-09-11 DIAGNOSIS — M1711 Unilateral primary osteoarthritis, right knee: Secondary | ICD-10-CM | POA: Insufficient documentation

## 2019-03-17 ENCOUNTER — Other Ambulatory Visit: Payer: Self-pay

## 2019-03-17 ENCOUNTER — Ambulatory Visit
Admission: EM | Admit: 2019-03-17 | Discharge: 2019-03-17 | Disposition: A | Discharge status: Home / Self Care | Payer: 59 | Attending: Family Medicine | Admitting: Family Medicine

## 2019-03-17 ENCOUNTER — Encounter: Payer: Self-pay | Admitting: Emergency Medicine

## 2019-03-17 DIAGNOSIS — Z20828 Contact with and (suspected) exposure to other viral communicable diseases: Secondary | ICD-10-CM

## 2019-03-17 DIAGNOSIS — Z20822 Contact with and (suspected) exposure to covid-19: Secondary | ICD-10-CM

## 2019-03-17 NOTE — ED Triage Notes (Signed)
Patient states that her husband tested positive for the COVID yesterday.  Patient states that she is not having any symptoms.  Patient is here to be tested for COVID.

## 2019-03-17 NOTE — Discharge Instructions (Addendum)
It was very nice seeing you today in clinic. Thank you for entrusting me with your care.  ° °You have been tested for SARS-CoV-2 (novel coronavirus) today. Testing results have been taking between 2 and 5 days. Please self quarantine, per  DHHS guidelines, until you have received negative test results.  ° °If you develop any symptoms or concerns, make arrangements to follow up with your regular doctor. If your symptoms are severe, please seek follow up care in the ER. Please remember, our [facility] providers are "right here with you" when you need us.  ° °Again, it was my pleasure to take care of you today. Thank you for choosing our clinic. I hope that you start to feel better quickly.  ° °Anacaren Kohan, MSN, APRN, FNP-C, CEN °Advanced Practice Provider °[facility] °
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
## 2019-03-19 LAB — NOVEL CORONAVIRUS, NAA (HOSP ORDER, SEND-OUT TO REF LAB; TAT 18-24 HRS): SARS-CoV-2, NAA: DETECTED — AB

## 2019-03-20 ENCOUNTER — Telehealth (HOSPITAL_COMMUNITY): Payer: Self-pay | Admitting: Emergency Medicine

## 2019-03-20 MED ORDER — ONDANSETRON 4 MG PO TBDP: 4.0000 mg | ORAL_TABLET | Freq: Three times a day (TID) | ORAL | 0 refills | Status: DC | PRN

## 2019-03-20 NOTE — Telephone Encounter (Signed)
Covid Positive, pt called and made aware,s tates she is feeling nauseous, okay to send zofran per Gaspar Bidding NP.

## 2019-04-08 ENCOUNTER — Ambulatory Visit
Admission: EM | Admit: 2019-04-08 | Discharge: 2019-04-08 | Disposition: A | Discharge status: Home / Self Care | Payer: 59

## 2019-04-08 NOTE — ED Notes (Signed)
Pt requesting work note from previous visit. Does not need to be seen again.

## 2019-04-25 ENCOUNTER — Ambulatory Visit: Payer: 59

## 2019-04-25 ENCOUNTER — Encounter: Payer: Self-pay | Admitting: Emergency Medicine

## 2019-04-25 ENCOUNTER — Ambulatory Visit (INDEPENDENT_AMBULATORY_CARE_PROVIDER_SITE_OTHER): Payer: 59

## 2019-04-25 ENCOUNTER — Other Ambulatory Visit: Payer: Self-pay

## 2019-04-25 ENCOUNTER — Ambulatory Visit
Admission: EM | Admit: 2019-04-25 | Discharge: 2019-04-25 | Disposition: A | Discharge status: Home / Self Care | Payer: 59 | Attending: Urgent Care | Admitting: Urgent Care

## 2019-04-25 DIAGNOSIS — M25562 Pain in left knee: Secondary | ICD-10-CM

## 2019-04-25 DIAGNOSIS — M79672 Pain in left foot: Secondary | ICD-10-CM

## 2019-04-25 DIAGNOSIS — W108XXA Fall (on) (from) other stairs and steps, initial encounter: Secondary | ICD-10-CM | POA: Diagnosis not present

## 2019-04-25 DIAGNOSIS — T07XXXA Unspecified multiple injuries, initial encounter: Secondary | ICD-10-CM

## 2019-04-25 DIAGNOSIS — M25532 Pain in left wrist: Secondary | ICD-10-CM

## 2019-04-25 DIAGNOSIS — M7918 Myalgia, other site: Secondary | ICD-10-CM

## 2019-04-25 DIAGNOSIS — Z6379 Other stressful life events affecting family and household: Secondary | ICD-10-CM

## 2019-04-25 DIAGNOSIS — S80912A Unspecified superficial injury of left knee, initial encounter: Secondary | ICD-10-CM

## 2019-04-25 DIAGNOSIS — M25522 Pain in left elbow: Secondary | ICD-10-CM | POA: Diagnosis not present

## 2019-04-25 DIAGNOSIS — M25572 Pain in left ankle and joints of left foot: Secondary | ICD-10-CM | POA: Diagnosis not present

## 2019-04-25 MED ORDER — TRAMADOL HCL 50 MG PO TABS: 50.0000 mg | ORAL_TABLET | Freq: Three times a day (TID) | ORAL | 0 refills | Status: DC | PRN

## 2019-04-25 NOTE — Discharge Instructions (Signed)
It was very nice seeing you today in clinic. Thank you for entrusting me with your care.   Rest, ice, and elevate painful areas. Use Tylenol and/or Ibuprofen as needed for pain. Will add in Tramadol for more severe pain. Wear ACE wrap and ankle brace for support and comfort.   Make arrangements to follow up with your regular doctor in 1 week for re-evaluation if not improving. If your symptoms/condition worsens, please seek follow up care either here or in the ER. Please remember, our [place] providers are "right here with you" when you need [place].   Again, it was my pleasure to take care of you today. Thank you for choosing our clinic. I hope that you start to feel better quickly.   Honor Loh, MSN, APRN, FNP-C, CEN Advanced Practice Provider [facility]

## 2019-04-25 NOTE — ED Provider Notes (Signed)
[place], [place]   Name: Cindy Grant DOB: May 30, 1966 MRN: [id number] CSN: [id number] PCP: Maxwell Caul, MD  Arrival date and time:  04/25/19 1141  Chief Complaint:  Fall resulting in multiple injuries   NOTE: Prior to seeing the patient today, I have reviewed the triage nursing documentation and vital signs. Clinical staff has updated patient's PMH/PSHx, current medication list, and drug allergies/intolerances to ensure comprehensive history available to assist in medical decision making.   History:   HPI: Cindy Grant is a 53 y.o. female who presents today with complaints of pain following a mechanical fall that occurred last night (04/24/2019). Patient reports that she was at her son's home when the fall occurred. While on the stairs, patient reports that she sustained a fall that caused her her twist her LEFT foot and ankle. When she landed, patient reports that she feel onto her LEFT knee and and outstretched hand. Patient presents today for evaluation of worsening pain and swelling to multiple sites. Patient complains of pain to virtually her entire LEFT side. She has pain with movement noted in her LEFT elbow and wrist. While she is able to ambulate, she notes significant pain in her LEFT knee. Of note, patient has had surgery in the past on her knee and is concerned. The majority of the patient's pain and swelling in his her LEFT ankle and foot. She notes difficulties with ambulation due to the pain. In efforts to conservatively manage her symptoms at home, the patient notes that she has used APAP and IBU, which have only minimally helped to improve her symptoms.    Patient tearful in clinic today, partly due to her acute injuries, but mainly because her husband (common law) is not doing well. Husband remains admitted at [facility] for complications related to SARS-CoV-2 (novel coronavirus). She notes that they are not expecting him to survive. Patient reporting issues with her job being in  jeopardy due to time that she ahs had to take off of work for her own SARS-CoV-2 recovery, as well as the time that she has been out with her husband.   Past Medical History:  Diagnosis Date  . Asthma   . Thyroid disease     Past Surgical History:  Procedure Laterality Date  . ABDOMINAL HYSTERECTOMY    . KNEE SURGERY Left   . TONSILLECTOMY      History reviewed. No pertinent family history.  Social History   Tobacco Use  . Smoking status: Current Every Day Smoker    Packs/day: 1.00    Types: Cigarettes  . Smokeless tobacco: Never Used  Substance Use Topics  . Alcohol use: Yes    Comment: occasionally  . Drug use: Not Currently    There are no active problems to display for this patient.   Home Medications:    Current Meds  Medication Sig  . levothyroxine (SYNTHROID, LEVOTHROID) 150 MCG tablet Take 150 mcg by mouth daily before breakfast.  . PARoxetine (PAXIL) 40 MG tablet Take 40 mg by mouth every morning.    Allergies:   Hydrocodone, Oxycodone, and Penicillins  Review of Systems (ROS): Review of Systems  Constitutional: Negative for chills and fever.  Respiratory: Negative for cough and shortness of breath.   Cardiovascular: Negative for chest pain and palpitations.  Musculoskeletal: Positive for gait problem and joint swelling. Negative for back pain, neck pain and neck stiffness.       Acute pain following fall  Skin: Negative for color change, pallor and rash.  Psychiatric/Behavioral: Negative for suicidal ideas. The patient is nervous/anxious.   All other systems reviewed and are negative.    Vital Signs: Today's Vitals   04/25/19 1200 04/25/19 1206 04/25/19 1406  BP:  (!) 143/71   Pulse:  74   Resp:  20   Temp:  98.5 F (36.9 C)   TempSrc:  Oral   SpO2:  93%   Weight: 230 lb (104.3 kg)    Height: 5\' 5"  (1.651 m)    PainSc: 5   5     Physical Exam: Physical Exam  Constitutional: She is oriented to person, place, and time and  well-developed, well-nourished, and in no distress.  HENT:  Head: Normocephalic and atraumatic.  Mouth/Throat: Mucous membranes are normal.  Eyes: Pupils are equal, round, and reactive to light.  Neck: Normal range of motion and full passive range of motion without pain. Neck supple.  Cardiovascular: Normal rate, regular rhythm, normal heart sounds and intact distal pulses. Exam reveals no gallop and no friction rub.  No murmur heard. Pulmonary/Chest: Effort normal and breath sounds normal. No respiratory distress. She has no wheezes. She has no rales.  Musculoskeletal:     Left elbow: She exhibits decreased range of motion (increased pain with flexsion and extension) and swelling (slight). She exhibits no effusion and no deformity. Tenderness (generalized) found.     Left wrist: She exhibits decreased range of motion (increased pain with AROM), tenderness (generalized; mainly ulnar distribution) and swelling (slight). She exhibits no effusion, no crepitus and no deformity.     Left knee: She exhibits swelling and effusion. She exhibits no ecchymosis, no deformity, no erythema, normal alignment and no LCL laxity. Tenderness (generalized) found.     Left ankle: She exhibits decreased range of motion and swelling. She exhibits no ecchymosis, no deformity and normal pulse. Tenderness. Lateral malleolus tenderness found.     Left foot: Normal capillary refill. Tenderness (generalized; mainly overlying lateral aspect for mid-foot), swelling (minimal) and crepitus present. No deformity.     Comments: (+) PMS noted distally to all sites of injury. Capillary refill WNL. Color and temperature of LUE/hand and LLE/foot normal.   Neurological: She is alert and oriented to person, place, and time. She has normal sensation, normal strength and normal reflexes. Gait (2/2 pain associated with acute injuries) abnormal.  Skin: Skin is warm and dry. No rash noted.  Psychiatric: Memory, affect and judgment normal. Her  mood appears anxious. She exhibits a depressed mood (related to failing health of significant other). She expresses no suicidal ideation.  Nursing note and vitals reviewed.   Urgent Care Treatments / Results:   LABS: PLEASE NOTE: all labs that were ordered this encounter are listed, however only abnormal results are displayed. Labs Reviewed - No data to display  EKG: -None  RADIOLOGY: Dg Elbow Complete Left  Result Date: 04/25/2019 CLINICAL DATA:  Status post fall yesterday with left elbow pain. EXAM: LEFT ELBOW - COMPLETE 3+ VIEW COMPARISON:  None. FINDINGS: There is no evidence of fracture, dislocation, or joint effusion. There is no evidence of arthropathy or other focal bone abnormality. Soft tissues are unremarkable. IMPRESSION: Negative. Electronically Signed   By: Abelardo Diesel M.D.   On: 04/25/2019 13:13   Dg Wrist Complete Left  Result Date: 04/25/2019 CLINICAL DATA:  Status post fall yesterday with left wrist pain. EXAM: LEFT WRIST - COMPLETE 3+ VIEW COMPARISON:  None. FINDINGS: There is no evidence of fracture or dislocation. There is no evidence of arthropathy or  other focal bone abnormality. Soft tissues are unremarkable. IMPRESSION: Negative. Electronically Signed   By: Sherian Rein M.D.   On: 04/25/2019 13:13   Dg Ankle Complete Left  Result Date: 04/25/2019 CLINICAL DATA:  Status post fall yesterday with left ankle pain. EXAM: LEFT ANKLE COMPLETE - 3+ VIEW COMPARISON:  None. FINDINGS: There is no evidence of fracture, dislocation, or joint effusion. There is plantar calcaneal spur. Soft tissues are unremarkable. IMPRESSION: No acute fracture or dislocation. Electronically Signed   By: Sherian Rein M.D.   On: 04/25/2019 13:14   Dg Knee Complete 4 Views Left  Result Date: 04/25/2019 CLINICAL DATA:  Status post fall yesterday with left knee pain. EXAM: LEFT KNEE - COMPLETE 4+ VIEW COMPARISON:  None. FINDINGS: No acute fracture or dislocation is identified. Small  suprapatellar effusion is noted. Patient status post prior knee repair. IMPRESSION: No acute abnormality noted. Electronically Signed   By: Sherian Rein M.D.   On: 04/25/2019 13:14   Dg Foot Complete Left  Result Date: 04/25/2019 CLINICAL DATA:  Status post fall yesterday with left foot pain. EXAM: LEFT FOOT - COMPLETE 3+ VIEW COMPARISON:  None. FINDINGS: There is no evidence of fracture or dislocation. Plantar calcaneal spur is noted. Soft tissues are unremarkable. IMPRESSION: No acute fracture or dislocation. Electronically Signed   By: Sherian Rein M.D.   On: 04/25/2019 13:15    PROCEDURES: Procedures  MEDICATIONS RECEIVED THIS VISIT: Medications - No data to display  PERTINENT CLINICAL COURSE NOTES/UPDATES:   Initial Impression / Assessment and Plan / Urgent Care Course:  Pertinent labs & imaging results that were available during my care of the patient were personally reviewed by me and considered in my medical decision making (see lab/imaging section of note for values and interpretations).  Cindy Grant is a 53 y.o. female who presents to [facility] today with complaints of Fall resulting in multiple injuries   Patient is well appearing overall in clinic today. She does not appear to be in any acute distress. Presenting symptoms (see HPI) and exam as documented above. Patient with pain virtually to her entire LEFT side. Diagnostic plain films of the LEFT elbow, wrist, knee, foot, and ankle were performed. Results reviewed as negative for acute fracture or dislocation. Films did demonstrate a small suprapatellar effusion in the knee. Discuss conservative management and referral to orthopedics as necessary. Patient with concerns about her knee, foot , and ankle. Fall likely resulted in muscle strain/sprain. Offered immobilization of the knee, however patient refused and opted to have knee placed in a compression wrap for comfort. Due to her difficulty with ambulation,  discussed need for sturdy support of the foot and ankle. I am not sure an ACE wrap would offer her the support that she needs. Will place patient in an aircast for support and comfort. Discussed rest, ice, and elevation of all painful areas. She refused crutches as offered. She was encouraged to applyice TID-QID for at least 15-20 minutes at a time. Patient allergic to hydrocodone and oxycodone. She was advised to use APAP and/or IBU as needed for pain. Will send in a short supply of Tramadol for PRN use.   Patient visibly upset today in clinic due to acute injuries and due to the failing health of her husband (common law) who has SARS-CoV-2 (novel coronavirus). Patient reporting that her job is in jeopardy due to time she had to take off to recover from SARS-CoV-2 herself, as well as the time that she  has been out to help care for her husband. We were able to assist her with getting FMLA previously. Patient encouraged to discuss intermittent FMLA with her PCP in order to cover time that she may need to be out as the health of her loved one continues to decline. Patient is very upset regarding her significant other. PMH (+) for depression for which she is on a daily SSRI medication. She is handling the situation appropriately and has a strong support system in place per her report.   Current clinical condition warrants patient being out of work in order to recover from her current injury/illness. She was provided with the appropriate documentation to provide to her place of employment that will allow for her to RTW on 04/29/2019 with no restrictions.   Discussed follow up with primary care physician in 1 week for re-evaluation. I have reviewed the follow up and strict return precautions for any new or worsening symptoms. Patient is aware of symptoms that would be deemed urgent/emergent, and would thus require further evaluation either here or in the emergency department. At the time of discharge, she  verbalized understanding and consent with the discharge plan as it was reviewed with her. All questions were fielded by provider and/or clinic staff prior to patient discharge.    Final Clinical Impressions / Urgent Care Diagnoses:   Final diagnoses:  Fall down stairs, initial encounter  Multiple injuries due to trauma  Musculoskeletal pain  Stress due to illness of family member    New Prescriptions:  [place] Controlled Substance Registry consulted? Yes, I have consulted the [place] Controlled Substances Registry for this patient, and feel the risk/benefit ratio today is favorable for proceeding with this prescription for a controlled substance.  . Discussed use of controlled substance medication to treat her acute pain.  o Reviewed [place] STOP Act regulations  o Clinic does not refill controlled substances over the phone without face to face evaluation.  . Safety precautions reviewed.  o Medications should not be bitten, chewed, sold, or taken with alcohol.  o Avoid use while working, driving, or operating heavy machinery.  o Side effects associated with the use of this particular medication reviewed. - Patient understands that this medication can cause CNS depression, increase her risk of falls, and even lead to overdose that may result in death, if used outside of the parameters that she and I discussed.  With all of this in mind, she knowingly accepts the risks and responsibilities associated with intended course of treatment, and elects to responsibly proceed as discussed.  Meds ordered this encounter  Medications  . traMADol (ULTRAM) 50 MG tablet    Sig: Take 1 tablet (50 mg total) by mouth every 8 (eight) hours as needed.    Dispense:  15 tablet    Refill:  0    Recommended Follow up Care:  Patient encouraged to follow up with the following provider within the specified time frame, or sooner as dictated by the severity of her symptoms. As always, she was instructed that for any  urgent/emergent care needs, she should seek care either here or in the emergency department for more immediate evaluation.  Follow-up Information    Maxwell Caulhazli, Firas, MD In 1 week.   Specialty: Internal Medicine Contact information: [address] [facility] [place] [id number] [phone]         NOTE: This note was prepared using Dragon dictation software along with smaller phrase technology. Despite my best ability to proofread,  there is the potential that transcriptional errors may still occur from this process, and are completely unintentional.    Verlee Monte, NP 04/26/19 2007

## 2019-04-25 NOTE — ED Triage Notes (Signed)
Pt c/o left ankle and foot pain. Also c/o left knee pain. She states that she fell down stairs last night. She can bare weight on her left foot but it is painful.

## 2019-04-26 ENCOUNTER — Encounter: Payer: Self-pay | Admitting: Urgent Care

## 2019-08-14 ENCOUNTER — Encounter: Payer: Self-pay | Admitting: Emergency Medicine

## 2019-08-14 ENCOUNTER — Other Ambulatory Visit: Payer: Self-pay

## 2019-08-14 ENCOUNTER — Ambulatory Visit
Admission: EM | Admit: 2019-08-14 | Discharge: 2019-08-14 | Disposition: A | Discharge status: Home / Self Care | Payer: 59 | Attending: Family Medicine | Admitting: Family Medicine

## 2019-08-14 DIAGNOSIS — W504XXA Accidental scratch by another person, initial encounter: Secondary | ICD-10-CM

## 2019-08-14 DIAGNOSIS — H00011 Hordeolum externum right upper eyelid: Secondary | ICD-10-CM

## 2019-08-14 DIAGNOSIS — S00211A Abrasion of right eyelid and periocular area, initial encounter: Secondary | ICD-10-CM | POA: Diagnosis not present

## 2019-08-14 DIAGNOSIS — T148XXA Other injury of unspecified body region, initial encounter: Secondary | ICD-10-CM

## 2019-08-14 MED ORDER — SULFAMETHOXAZOLE-TRIMETHOPRIM 800-160 MG PO TABS: 1.0000 | ORAL_TABLET | Freq: Two times a day (BID) | ORAL | 0 refills | Status: AC

## 2019-08-14 NOTE — ED Provider Notes (Signed)
[facility]    CSN: [id number] Arrival date & time: 08/14/19  1400      History   Chief Complaint Chief Complaint  Patient presents with  . Eye Problem    HPI Cindy Grant is a 54 y.o. female.   [name] presents with complaints of right upper eye lid swelling and discomfort. Approximately 4-5 days ago she accidentally scratched the superior aspect of the upper lid with her nail, causing an abrasion. She has been applying ointment to this. It has had mild redness around it. Yesterday noted swelling at her right upper last line which is worse today. No specific eye ball pain but pain to lid and surrounding soft tissue of right eye. No photophobia. Tenderness surrounding eye. Had had stye's in the past but they have promptly resolved. Hasn't taken any medications or tried any therapies for her symptoms. She has had some tearing from the eye but no discharge.    ROS per HPI, negative if not otherwise mentioned.      Past Medical History:  Diagnosis Date  . Asthma   . Thyroid disease     There are no problems to display for this patient.   Past Surgical History:  Procedure Laterality Date  . ABDOMINAL HYSTERECTOMY    . KNEE SURGERY Left   . TONSILLECTOMY      OB History   No obstetric history on file.      Home Medications    Prior to Admission medications   Medication Sig Start Date End Date Taking? Authorizing Provider  levothyroxine (SYNTHROID, LEVOTHROID) 150 MCG tablet Take 150 mcg by mouth daily before breakfast.   Yes [provider]  PARoxetine (PAXIL) 40 MG tablet Take 40 mg by mouth every morning.   Yes [provider]  sulfamethoxazole-trimethoprim (BACTRIM DS) 800-160 MG tablet Take 1 tablet by mouth 2 (two) times daily for 7 days. 08/14/19 08/21/19  Zigmund Gottron, NP  fluticasone-salmeterol (ADVAIR HFA) 676-19 MCG/ACT inhaler Inhale 2 puffs into the lungs 2 (two) times daily.  03/17/19  [provider]   losartan (COZAAR) 50 MG tablet Take by mouth. 08/20/18 04/25/19  [provider]    Family History History reviewed. No pertinent family history.  Social History Social History   Tobacco Use  . Smoking status: Current Every Day Smoker    Packs/day: 1.00    Types: Cigarettes  . Smokeless tobacco: Never Used  Substance Use Topics  . Alcohol use: Yes    Comment: occasionally  . Drug use: Not Currently     Allergies   Hydrocodone, Oxycodone, and Penicillins   Review of Systems Review of Systems   Physical Exam Triage Vital Signs ED Triage Vitals  Enc Vitals Group     BP 08/14/19 1418 (!) 152/83     Pulse Rate 08/14/19 1418 72     Resp 08/14/19 1418 18     Temp 08/14/19 1418 98.1 F (36.7 C)     Temp Source 08/14/19 1418 Oral     SpO2 08/14/19 1418 97 %     Weight 08/14/19 1416 230 lb (104.3 kg)     Height 08/14/19 1416 5\' 5"  (1.651 m)     Head Circumference --      Peak Flow --      Pain Score 08/14/19 1416 5     Pain Loc --      Pain Edu? --      Excl. in [place]? --  No data found.  Updated Vital Signs BP (!) 152/83 (BP Location: Right Arm)   Pulse 72   Temp 98.1 F (36.7 C) (Oral)   Resp 18   Ht 5\' 5"  (1.651 m)   Wt 230 lb (104.3 kg)   SpO2 97%   BMI 38.27 kg/m   Visual Acuity Right Eye Distance:   Left Eye Distance:   Bilateral Distance:    Right Eye Near:   Left Eye Near:    Bilateral Near:     Physical Exam Constitutional:      General: She is not in acute distress.    Appearance: She is well-developed.  Eyes:     General: Vision grossly intact.        Right eye: Hordeolum present. No discharge.     Extraocular Movements: Extraocular movements intact.     Conjunctiva/sclera: Conjunctivae normal.     Pupils: Pupils are equal, round, and reactive to light.   Cardiovascular:     Rate and Rhythm: Normal rate.  Pulmonary:     Effort: Pulmonary effort is normal.  Skin:    General: Skin is warm and dry.  Neurological:      Mental Status: She is alert and oriented to person, place, and time.      UC Treatments / Results  Labs (all labs ordered are listed, but only abnormal results are displayed) Labs Reviewed - No data to display  EKG   Radiology No results found.  Procedures Procedures (including critical care time)  Medications Ordered in UC Medications - No data to display  Initial Impression / Assessment and Plan / UC Course  I have reviewed the triage vital signs and the nursing notes.  Pertinent labs & imaging results that were available during my care of the patient were reviewed by me and considered in my medical decision making (see chart for details).     Abrasion as well as hordeolum with redness swelling and tenderness surrounding, to right upper lid. Due to both of these opted to cover with oral antibiotics. Warm compresses, limited touching with hand, disposal of potential exposed beauty products discussed. Follow up with ophthalmology recommended prn. Return precautions provided. Patient verbalized understanding and agreeable to plan.   Final Clinical Impressions(s) / UC Diagnoses   Final diagnoses:  Hordeolum of right upper eyelid, unspecified hordeolum type  Skin abrasion     Discharge Instructions     Because of both of the areas to your eye lid we will go ahead and start oral antibiotics.  Warm compresses regularly to help with the stye to the lash line. 20 minutes at a time every couple of hours.  Please follow up with ophthalmology if no improvement with antibiotics or any worsening   ED Prescriptions    Medication Sig Dispense Auth. Provider   sulfamethoxazole-trimethoprim (BACTRIM DS) 800-160 MG tablet Take 1 tablet by mouth 2 (two) times daily for 7 days. 14 tablet , NP     PDMP not reviewed this encounter.   Georgetta Haber, NP 08/14/19 1457

## 2019-08-14 NOTE — Discharge Instructions (Signed)
Because of both of the areas to your eye lid we will go ahead and start oral antibiotics.  Warm compresses regularly to help with the stye to the lash line. 20 minutes at a time every couple of hours.  Please follow up with ophthalmology if no improvement with antibiotics or any worsening

## 2019-08-14 NOTE — ED Triage Notes (Addendum)
Patient c/o swollen right eye that started yesterday. She states the area is painful and does itch. She also is c/o swollen lymph note on the right side of her neck that she noticed this morning.

## 2019-11-12 DIAGNOSIS — R296 Repeated falls: Secondary | ICD-10-CM | POA: Insufficient documentation

## 2019-11-12 DIAGNOSIS — N3281 Overactive bladder: Secondary | ICD-10-CM | POA: Insufficient documentation

## 2019-12-01 DIAGNOSIS — Z9289 Personal history of other medical treatment: Secondary | ICD-10-CM | POA: Insufficient documentation

## 2019-12-06 DIAGNOSIS — Z1211 Encounter for screening for malignant neoplasm of colon: Secondary | ICD-10-CM | POA: Insufficient documentation

## 2019-12-11 DIAGNOSIS — E7800 Pure hypercholesterolemia, unspecified: Secondary | ICD-10-CM | POA: Insufficient documentation

## 2019-12-11 DIAGNOSIS — Z8673 Personal history of transient ischemic attack (TIA), and cerebral infarction without residual deficits: Secondary | ICD-10-CM | POA: Insufficient documentation

## 2021-01-14 ENCOUNTER — Encounter: Payer: Self-pay | Admitting: Emergency Medicine

## 2021-01-14 ENCOUNTER — Emergency Department
Admission: EM | Admit: 2021-01-14 | Discharge: 2021-01-14 | Disposition: A | Discharge status: Home / Self Care | Payer: Medicaid Other | Attending: Student in an Organized Health Care Education/Training Program | Admitting: Student in an Organized Health Care Education/Training Program

## 2021-01-14 ENCOUNTER — Other Ambulatory Visit: Payer: Self-pay

## 2021-01-14 ENCOUNTER — Emergency Department: Payer: Medicaid Other

## 2021-01-14 DIAGNOSIS — S6992XA Unspecified injury of left wrist, hand and finger(s), initial encounter: Secondary | ICD-10-CM

## 2021-01-14 DIAGNOSIS — W19XXXA Unspecified fall, initial encounter: Secondary | ICD-10-CM | POA: Insufficient documentation

## 2021-01-14 DIAGNOSIS — S63512A Sprain of carpal joint of left wrist, initial encounter: Secondary | ICD-10-CM | POA: Insufficient documentation

## 2021-01-14 DIAGNOSIS — Z7951 Long term (current) use of inhaled steroids: Secondary | ICD-10-CM | POA: Insufficient documentation

## 2021-01-14 DIAGNOSIS — F17210 Nicotine dependence, cigarettes, uncomplicated: Secondary | ICD-10-CM | POA: Insufficient documentation

## 2021-01-14 DIAGNOSIS — J45909 Unspecified asthma, uncomplicated: Secondary | ICD-10-CM | POA: Insufficient documentation

## 2021-01-14 DIAGNOSIS — M25532 Pain in left wrist: Secondary | ICD-10-CM

## 2021-01-14 MED ORDER — TRAMADOL HCL 50 MG PO TABS: 50.0000 mg | ORAL_TABLET | Freq: Four times a day (QID) | ORAL | 0 refills | Status: DC | PRN

## 2021-01-14 NOTE — ED Provider Notes (Signed)
[facility] Emergency Department Provider Note  ____________________________________________   Event Date/Time   First MD Initiated Contact with Patient 01/14/21 [phone]     (approximate)  I have reviewed the triage vital signs and the nursing notes.   HISTORY  Chief Complaint Wrist Pain   HPI Cindy Grant is a 55 y.o. female presents to the ED with complaint of continued left wrist pain.  Patient states she fell 3 weeks ago and is continued to have pain.  She now reports that the pain radiates into her thumb and she also has decreased range of motion.  She reports that she felt a popping sensation.  She has been taking acetaminophen without any relief.         Past Medical History:  Diagnosis Date   Asthma    Thyroid disease     There are no problems to display for this patient.   Past Surgical History:  Procedure Laterality Date   ABDOMINAL HYSTERECTOMY     KNEE SURGERY Left    TONSILLECTOMY      Prior to Admission medications   Medication Sig Start Date End Date Taking? Authorizing Provider  traMADol (ULTRAM) 50 MG tablet Take 1 tablet (50 mg total) by mouth every 6 (six) hours as needed. 01/14/21  Yes Bridget Hartshorn L, PA-C  levothyroxine (SYNTHROID, LEVOTHROID) 150 MCG tablet Take 150 mcg by mouth daily before breakfast.    [provider]  PARoxetine (PAXIL) 40 MG tablet Take 40 mg by mouth every morning.    [provider]  fluticasone-salmeterol (ADVAIR HFA) 115-21 MCG/ACT inhaler Inhale 2 puffs into the lungs 2 (two) times daily.  03/17/19  [provider]  losartan (COZAAR) 50 MG tablet Take by mouth. 08/20/18 04/25/19  [provider]    Allergies Hydrocodone, Oxycodone, and Penicillins  No family history on file.  Social History Social History   Tobacco Use   Smoking status: Every Day    Packs/day: 1.00    Types: Cigarettes   Smokeless tobacco: Never  Vaping Use   Vaping Use: Never  used  Substance Use Topics   Alcohol use: Yes    Comment: occasionally   Drug use: Not Currently    Review of Systems Constitutional: No fever/chills Eyes: No visual changes. Cardiovascular: Denies chest pain. Respiratory: Denies shortness of breath. Musculoskeletal: Positive for left wrist pain. Skin: Negative for rash. Neurological: Negative for headaches, focal weakness or numbness. ____________________________________________   PHYSICAL EXAM:  VITAL SIGNS: ED Triage Vitals [01/14/21 0832]  Enc Vitals Group     BP 109/69     Pulse Rate (!) 110     Resp 18     Temp 97.9 F (36.6 C)     Temp Source Oral     SpO2 93 %     Weight 229 lb 15 oz (104.3 kg)     Height      Head Circumference      Peak Flow      Pain Score      Pain Loc      Pain Edu?      Excl. in GC?     Constitutional: Alert and oriented. Well appearing and in no acute distress. Eyes: Conjunctivae are normal.  Head: Atraumatic. Neck: No stridor.   Cardiovascular: Normal rate, regular rhythm. Grossly normal heart sounds.  Good peripheral circulation. Respiratory: Normal respiratory effort.  No retractions. Lungs CTAB. Musculoskeletal: Exam of the left wrist there is no gross deformity  however there is marked tenderness on palpation of the radial aspect with decreased range of motion secondary to patient's increased pain.  Skin is intact and no discoloration is noted.  Radial pulses present.  Patient is slowly able to move digits distally but is guarding secondary to increased pain.  Flexion and extension of the wrist itself is moderately restricted as well as this causes pain for the patient. Neurologic:  Normal speech and language. No gross focal neurologic deficits are appreciated. No gait instability. Skin:  Skin is warm, dry and intact. No rash noted. Psychiatric: Mood and affect are normal. Speech and behavior are normal.  ____________________________________________   LABS (all labs ordered are  listed, but only abnormal results are displayed)  Labs Reviewed - No data to display ____________________________________________  RADIOLOGY I, Tommi Rumps, personally viewed and evaluated these images (plain radiographs) as part of my medical decision making, as well as reviewing the written report by the radiologist.   Official radiology report(s): DG Wrist Complete Left  Result Date: 01/14/2021 CLINICAL DATA:  Left wrist pain following fall several weeks ago, initial encounter EXAM: LEFT WRIST - COMPLETE 3+ VIEW COMPARISON:  04/25/2019 FINDINGS: Changes of ulnar minus variant are noted. Slight widening of the scapholunate space is noted but stable from the prior exam likely related to longstanding ligamentous injury no soft tissue swelling is seen. No acute fracture is noted. IMPRESSION: No acute abnormality noted. Stable widening of the scapholunate space likely related to ligamentous injury. Electronically Signed   By: Alcide Clever M.D.   On: 01/14/2021 09:18    ____________________________________________   PROCEDURES  Procedure(s) performed (including Critical Care):  Procedures   ____________________________________________   INITIAL IMPRESSION / ASSESSMENT AND PLAN / ED COURSE  As part of my medical decision making, I reviewed the following data within the electronic MEDICAL RECORD NUMBER Notes from prior ED visits and [place] Controlled Substance Database  55 year old female presents to the ED with complaint of right wrist pain that continues to hurt after fall 3 weeks ago.  Patient was not seen initially for injury to her wrist after her fall.  She states that is now worse than it was initially.  Range of motion was restricted especially with flexion and extension.  X-rays were negative for acute bony injury.  There is a space at the scaphoid lunate area that suggests possible ligament injury.  This was discussed with the patient.  She was placed in a cock-up wrist splint.  She  is encouraged to follow-up with Dr. Odis Luster who is the orthopedist on-call.  Because patient has had difficulties taking pain medications in the past she states that the only medicine that she has been able to take has been tramadol.  DEA check does confirm that patient has had tramadol in the past.  ____________________________________________   FINAL CLINICAL IMPRESSION(S) / ED DIAGNOSES  Final diagnoses:  Acute pain of left wrist  Scapholunate ligament injury with no instability, left, initial encounter     ED Discharge Orders          Ordered    traMADol (ULTRAM) 50 MG tablet  Every 6 hours PRN        01/14/21 0940             Note:  This document was prepared using Dragon voice recognition software and may include unintentional dictation errors.    Tommi Rumps, PA-C 01/14/21 1111    Willy Eddy, MD 01/14/21 1350

## 2021-01-14 NOTE — ED Triage Notes (Signed)
Pt in w/pain to L wrist after fall 3 wks ago. Pain, swelling and immobility has worsened. Able to move all fingers but thumb and wrist rotation is limited. Good pulse

## 2021-01-14 NOTE — Discharge Instructions (Addendum)
Call make an appointment with Dr. Odis Luster who is the orthopedist on-call and is located at [facility].  Begin taking ibuprofen or Aleve for inflammation which will help more with pain than the acetaminophen.  A prescription for tramadol was sent to the pharmacy.  This is the medication that you have taken in the past without any adverse reactions like itching.  Wear the splint for added support and protection until you are seen by the orthopedist.

## 2021-11-12 DIAGNOSIS — J449 Chronic obstructive pulmonary disease, unspecified: Secondary | ICD-10-CM | POA: Insufficient documentation

## 2021-11-12 DIAGNOSIS — J4489 Other specified chronic obstructive pulmonary disease: Secondary | ICD-10-CM | POA: Insufficient documentation

## 2021-11-15 DIAGNOSIS — Z87891 Personal history of nicotine dependence: Secondary | ICD-10-CM | POA: Insufficient documentation

## 2022-10-18 DIAGNOSIS — H524 Presbyopia: Secondary | ICD-10-CM | POA: Diagnosis not present

## 2022-10-18 DIAGNOSIS — Z0100 Encounter for examination of eyes and vision without abnormal findings: Secondary | ICD-10-CM | POA: Diagnosis not present

## 2022-11-03 ENCOUNTER — Telehealth: Payer: Self-pay | Admitting: Family Medicine

## 2022-11-04 ENCOUNTER — Ambulatory Visit: Payer: Medicare PPO | Admitting: Family Medicine

## 2022-11-04 NOTE — Progress Notes (Deleted)
I,Trena Dunavan S Ellanora Rayborn,acting as a Neurosurgeon for [employer], DO.,have documented all relevant documentation on the behalf of [employer], DO,as directed by  [employer], DO while in the presence of SARAH N PARDUE, DO.   New patient visit   Patient: Cindy Grant   DOB: 26-May-1966   57 y.o. Female  MRN: [id number] Visit Date: 11/04/2022  Today's healthcare provider: Sherlyn Hay, DO   No chief complaint on file.  Subjective    Cindy Grant is a 57 y.o. female who presents today as a new patient to establish care.  HPI   03/16/2016 HCV Ab - [facility] 0.0 - 0.9 s/co ratio <0.1    Past Medical History:  Diagnosis Date   Asthma    Thyroid disease    Past Surgical History:  Procedure Laterality Date   ABDOMINAL HYSTERECTOMY     KNEE SURGERY Left    TONSILLECTOMY     Family Status  Relation Name Status   Mother  Deceased   Father  Deceased   No family history on file. Social History   Socioeconomic History   Marital status: Single    Spouse name: Not on file   Number of children: Not on file   Years of education: Not on file   Highest education level: Not on file  Occupational History   Not on file  Tobacco Use   Smoking status: Every Day    Packs/day: 1    Types: Cigarettes   Smokeless tobacco: Never  Vaping Use   Vaping Use: Never used  Substance and Sexual Activity   Alcohol use: Yes    Comment: occasionally   Drug use: Not Currently   Sexual activity: Not on file  Other Topics Concern   Not on file  Social History Narrative   Not on file   Social Determinants of Health   Financial Resource Strain: Not on file  Food Insecurity: Not on file  Transportation Needs: Not on file  Physical Activity: Not on file  Stress: Not on file  Social Connections: Not on file   Outpatient Medications Prior to Visit  Medication Sig   levothyroxine (SYNTHROID, LEVOTHROID) 150 MCG tablet Take 150 mcg by mouth daily before breakfast.   PARoxetine (PAXIL)  40 MG tablet Take 40 mg by mouth every morning.   traMADol (ULTRAM) 50 MG tablet Take 1 tablet (50 mg total) by mouth every 6 (six) hours as needed.   No facility-administered medications prior to visit.   Allergies  Allergen Reactions   Hydrocodone Itching   Oxycodone Itching   Penicillins Hives    Immunization History  Administered Date(s) Administered   Influenza,inj,Quad PF,6+ Mos 03/22/2017, 07/09/2020   Janssen (J&J) SARS-COV-2 Vaccination 12/04/2019   Pneumococcal Polysaccharide-23 04/24/2017   Tdap 02/12/2016    Health Maintenance  Topic Date Due   Medicare Annual Wellness (AWV)  Never done   HIV Screening  Never done   Hepatitis C Screening  Never done   PAP SMEAR-Modifier  Never done   COLONOSCOPY (Pts 45-68yrs Insurance coverage will need to be confirmed)  Never done   MAMMOGRAM  Never done   Zoster Vaccines- Shingrix (1 of 2) Never done   Lung Cancer Screening  09/29/2016   COVID-19 Vaccine (2 - 2023-24 season) 02/25/2022   INFLUENZA VACCINE  01/26/2023   DTaP/Tdap/Td (2 - Td or Tdap) 02/11/2026   HPV VACCINES  Aged Out    Patient Care Team: Pcp, No as PCP -  General  Review of Systems  {Labs  Heme  Chem  Endocrine  Serology  Results Review (optional):23779}   Objective    There were no vitals taken for this visit. BP Readings from Last 3 Encounters:  01/14/21 109/69  08/14/19 (!) 152/83  04/25/19 (!) 143/71   Wt Readings from Last 3 Encounters:  01/14/21 229 lb 15 oz (104.3 kg)  08/14/19 230 lb (104.3 kg)  04/25/19 230 lb (104.3 kg)      Physical Exam ***  Depression Screen     No data to display         No results found for any visits on 11/04/22.  Assessment & Plan     ***  No follow-ups on file.     {provider attestation***:1}   Sherlyn Hay, DO  [facility] [phone] (phone) [phone] (fax)  [facility]

## 2023-02-08 ENCOUNTER — Encounter: Payer: Self-pay | Admitting: Podiatry

## 2023-02-08 ENCOUNTER — Ambulatory Visit (INDEPENDENT_AMBULATORY_CARE_PROVIDER_SITE_OTHER): Payer: Medicare HMO

## 2023-02-08 ENCOUNTER — Ambulatory Visit (INDEPENDENT_AMBULATORY_CARE_PROVIDER_SITE_OTHER): Payer: Medicare HMO | Admitting: Podiatry

## 2023-02-08 DIAGNOSIS — M2012 Hallux valgus (acquired), left foot: Secondary | ICD-10-CM

## 2023-02-08 DIAGNOSIS — M21611 Bunion of right foot: Secondary | ICD-10-CM

## 2023-02-08 DIAGNOSIS — M19071 Primary osteoarthritis, right ankle and foot: Secondary | ICD-10-CM

## 2023-02-08 DIAGNOSIS — M2011 Hallux valgus (acquired), right foot: Secondary | ICD-10-CM | POA: Diagnosis not present

## 2023-02-08 DIAGNOSIS — M21612 Bunion of left foot: Secondary | ICD-10-CM

## 2023-02-08 NOTE — Patient Instructions (Signed)
The procedure we talked about is a fusion of your great toe joint to fix both the arthritis and the bunion.  I will also take some bone graft out of your heel and put this in the big toe fusion.  The toe will not move up and down or side-to-side anymore but will fix both issues  Do not smoke cigarettes 6 weeks before and after surgery  After surgery: -You will be in a boot after surgery but you cannot put weight on it.  I would recommend getting a rolling knee scooter that you can order on [employer].  [employer] will not cover the cost of 1 but they are usually fairly affordable and you will need it for your left foot as well  -After 3 weeks I will let you start putting weight on the foot but only gradually to get around the house  -You will be in the boot for a total of 2 months before going back to a shoe

## 2023-02-08 NOTE — Progress Notes (Addendum)
  Subjective:  Patient ID: Cindy Grant, female    DOB: 03/02/1966,  MRN: [id number]  Chief Complaint  Patient presents with   Foot Pain    "I have these bunions." N - bunions L - bilateral, right >left D - 20 yrs. O - gradually worse C - toes cross over, aches A - shoes T - good support shoes, orthotics    57 y.o. female presents with the above complaint. History confirmed with patient. Recently has gotten much worse. She has taken tylenol and ibuprofen OTC when it hurts and this does not help. The bunions have been there for 20 years but she has been treating them with OTC meds and wider shoes and orthortics for the last 6 months without relief.  Objective:  Physical Exam: warm, good capillary refill, no trophic changes or ulcerative lesions, normal DP and PT pulses, and normal sensory exam.  Bilaterally with right worse than left in severity she has hallux valgus deformity with plantarflexion and abduction of the hallux, pain on the medial eminence and with range of motion of the joint and in the sesamoid complex and the metatarsophalangeal joint   Radiographs: Multiple views x-ray of both feet: Severe hallux valgus deformity with abducted hallux, degenerative changes noted in first MTPJ, first IM angle > 16, hallux abductus angle 45, tibial sesamoid position 5, metatarsus adductus present with Engels angle of 22 Assessment:   1. Valgus deformity of both great toes   2. Hallux valgus with bunions, left   3. Hallux valgus with bunions, right   4. Osteoarthritis of first metatarsophalangeal (MTP) joint of right foot      Plan:  Patient was evaluated and treated and all questions answered.  Discussed the etiology and treatment including surgical and non surgical treatment for painful bunions.  She has exhausted all non surgical treatment prior to this visit including shoe gear changes and padding.  She desires surgical intervention. We discussed all risks including but not  limited to: pain, swelling, infection, scar, numbness which may be temporary or permanent, chronic pain, stiffness, nerve pain or damage, wound healing problems, bone healing problems including delayed or non-union and recurrence. Specifically we discussed the following procedures: First metatarsal phalangeal joint arthrodesis on the right foot with bone graft from the heel.  We discussed the rationale for this procedure including correction of both the hallux valgus deformity and the arthritic changes present in the metatarsal phalangeal joint. Informed consent was signed today. Surgery will be scheduled at a mutually agreeable date. Information regarding this will be forwarded to our surgery scheduler. In the interim until surgery I recommended utilizing as wide of shoes as possible, take NSAIDs or tylenol as tolerated for pain, and a bunion padding shield which can be purchased online.  I have advised her to stop smoking 6 weeks before and after surgery and she is says that she will do this.  We discussed the risk of smoking with surgery and its impact on bone and soft tissue healing.   Return for after surgery.

## 2023-02-11 LAB — COLOGUARD
COLOGUARD: POSITIVE — AB
Cologuard: POSITIVE — AB

## 2023-04-25 ENCOUNTER — Telehealth: Payer: Self-pay | Admitting: Podiatry

## 2023-04-25 NOTE — Telephone Encounter (Signed)
DOS- 05/05/2023  HALLUX MPJ FUSION [id number] BONE GRAFT RT-[id number]  [employer] EFFECTIVE DATE- 10/26/2022  DEDUCTIBLE- $240.00 WITH REMAINING $240.00 OOP- $8850.00 WITH REMAINING $8,300.00  COINSURANCE- 20%  PER THE [place] WEBSITE PORTAL, PRIOR AUTH WAS BEEN APPROVED FOR CPT CODE 45409, GOOD FROM 05/05/2023- 05/05/2023. FOR CPT CODE 81191 PRIOR AUTH IS NOT REQUIRED.  [facility] ALSO HAS MEDICAID [place] ACCESS, PER [employer] C[employer]AGE EXPIRES 04/27/2023. AND NEW COVERAGE DOES NOT BEGIN UNTIL 05/24/2023 PER RTE.

## 2023-05-05 ENCOUNTER — Other Ambulatory Visit: Payer: Self-pay | Admitting: Podiatry

## 2023-05-05 DIAGNOSIS — M2011 Hallux valgus (acquired), right foot: Secondary | ICD-10-CM | POA: Diagnosis not present

## 2023-05-05 MED ORDER — ASPIRIN 325 MG PO TBEC: 325.0000 mg | DELAYED_RELEASE_TABLET | Freq: Two times a day (BID) | ORAL | 0 refills | Status: AC

## 2023-05-05 MED ORDER — ACETAMINOPHEN 500 MG PO TABS: 1000.0000 mg | ORAL_TABLET | Freq: Four times a day (QID) | ORAL | 0 refills | Status: AC | PRN

## 2023-05-05 MED ORDER — IBUPROFEN 600 MG PO TABS: 600.0000 mg | ORAL_TABLET | Freq: Three times a day (TID) | ORAL | 0 refills | Status: AC | PRN

## 2023-05-05 MED ORDER — TRAMADOL HCL 50 MG PO TABS: 100.0000 mg | ORAL_TABLET | Freq: Four times a day (QID) | ORAL | 0 refills | Status: AC | PRN

## 2023-05-05 MED ORDER — DOXYCYCLINE HYCLATE 100 MG PO TABS: 100.0000 mg | ORAL_TABLET | Freq: Two times a day (BID) | ORAL | 0 refills | Status: DC

## 2023-05-05 MED ORDER — GABAPENTIN 300 MG PO CAPS: 300.0000 mg | ORAL_CAPSULE | Freq: Three times a day (TID) | ORAL | 0 refills | Status: DC

## 2023-05-05 NOTE — Addendum Note (Signed)
Addended byLilian Kapur, Raejean Swinford R on: 05/05/2023 08:54 AM   Modules accepted: Orders

## 2023-05-05 NOTE — Progress Notes (Signed)
11/8 R 1st MTP fusion

## 2023-05-10 ENCOUNTER — Encounter: Payer: Self-pay | Admitting: Podiatry

## 2023-05-10 ENCOUNTER — Ambulatory Visit (INDEPENDENT_AMBULATORY_CARE_PROVIDER_SITE_OTHER): Payer: Medicare HMO | Admitting: Podiatry

## 2023-05-10 ENCOUNTER — Ambulatory Visit (INDEPENDENT_AMBULATORY_CARE_PROVIDER_SITE_OTHER): Payer: Medicare HMO

## 2023-05-10 DIAGNOSIS — M2011 Hallux valgus (acquired), right foot: Secondary | ICD-10-CM

## 2023-05-10 DIAGNOSIS — Z9889 Other specified postprocedural states: Secondary | ICD-10-CM

## 2023-05-10 NOTE — Progress Notes (Signed)
  Subjective:  Patient ID: Cindy Grant, female    DOB: 1966-01-26,  MRN: [id number]  Chief Complaint  Patient presents with   Routine Post Op    POV #1, DOS 05/05/2023, FUSION OF RIGHT GREAT TOE JOINT, BONE GRAFT FROM HEEL "It's great!"    57 y.o. female returns for post-op check.  Has hardly had any pain  Review of Systems: Negative except as noted in the HPI. Denies N/V/F/Ch.   Objective:  There were no vitals filed for this visit. There is no height or weight on file to calculate BMI. Constitutional Well developed. Well nourished.  Vascular Foot warm and well perfused. Capillary refill normal to all digits.  Calf is soft and supple, no posterior calf or knee pain, negative Homans' sign  Neurologic Normal speech. Oriented to person, place, and time. Epicritic sensation to light touch grossly present bilaterally.  Dermatologic Skin healing well without signs of infection. Skin edges well coapted without signs of infection.  Callus medial hallux  Orthopedic: Tenderness to palpation noted about the surgical site.   Multiple view plain film radiographs: Good correction noted with hardware intact and in position Assessment:   1. Status post surgery    Plan:  Patient was evaluated and treated and all questions answered.  S/p foot surgery right -Progressing as expected post-operatively. -XR: Noted above no complication -WB Status: NWB in CAM boot with knee scooter -Sutures: Return 2 weeks to remove. -Medications: No refills required -Foot redressed.  May remove and shower on Monday replace with Neosporin and gauze dressing and Ace wrap -Continue ice and elevation -Plan to debride callus next visit  No follow-ups on file.

## 2023-05-22 ENCOUNTER — Encounter: Payer: Self-pay | Admitting: Podiatry

## 2023-05-22 ENCOUNTER — Ambulatory Visit (INDEPENDENT_AMBULATORY_CARE_PROVIDER_SITE_OTHER): Payer: Medicare HMO | Admitting: Podiatry

## 2023-05-22 DIAGNOSIS — Z9889 Other specified postprocedural states: Secondary | ICD-10-CM

## 2023-05-22 NOTE — Progress Notes (Signed)
  Subjective:  Patient ID: Cindy Grant, female    DOB: 06-01-1966,  MRN: [id number]  Chief Complaint  Patient presents with   Post-op Problem    POV #2, DOS 05/05/2023, FUSION OF RIGHT GREAT TOE JOINT, BONE GRAFT FROM HEEL "I'm great.  I'm hoping he will take me out of this boot.  My knee is killing me."    57 y.o. female returns for post-op check.    Review of Systems: Negative except as noted in the HPI. Denies N/V/F/Ch.   Objective:  There were no vitals filed for this visit. There is no height or weight on file to calculate BMI. Constitutional Well developed. Well nourished.  Vascular Foot warm and well perfused. Capillary refill normal to all digits.  Calf is soft and supple, no posterior calf or knee pain, negative Homans' sign  Neurologic Normal speech. Oriented to person, place, and time. Epicritic sensation to light touch grossly present bilaterally.  Dermatologic Incision well-healed not hypertrophic  Orthopedic: Tenderness to palpation noted about the surgical site.   Multiple view plain film radiographs: Good correction noted with hardware intact and in position Assessment:   1. Status post surgery    Plan:  Patient was evaluated and treated and all questions answered.  S/p foot surgery right -Doing quite well can begin partial weightbearing in the boot to the heel and gradually increase her activity still need knee scooter for longer distances.  Return in 3 weeks for new radiographs and hopefully transition to a surgical shoe at that point.  No follow-ups on file.

## 2023-05-24 ENCOUNTER — Encounter: Payer: Medicare HMO | Admitting: Podiatry

## 2023-06-02 ENCOUNTER — Telehealth: Payer: Self-pay | Admitting: Podiatry

## 2023-06-02 NOTE — Telephone Encounter (Signed)
Patient is asking if she can put lotion or something on her foot where the wound is?

## 2023-06-14 ENCOUNTER — Telehealth: Payer: Self-pay

## 2023-06-14 ENCOUNTER — Ambulatory Visit (INDEPENDENT_AMBULATORY_CARE_PROVIDER_SITE_OTHER): Payer: Medicare HMO

## 2023-06-14 ENCOUNTER — Ambulatory Visit (INDEPENDENT_AMBULATORY_CARE_PROVIDER_SITE_OTHER): Payer: Medicare HMO | Admitting: Podiatry

## 2023-06-14 ENCOUNTER — Encounter: Payer: Self-pay | Admitting: Podiatry

## 2023-06-14 DIAGNOSIS — Z9889 Other specified postprocedural states: Secondary | ICD-10-CM

## 2023-06-14 DIAGNOSIS — M2011 Hallux valgus (acquired), right foot: Secondary | ICD-10-CM

## 2023-06-14 NOTE — Progress Notes (Signed)
  Subjective:  Patient ID: Cindy Grant, female    DOB: 1965/08/25,  MRN: [id number]  Chief Complaint  Patient presents with   Routine Post Op    "It is okay.  I wish every patient's recovery could be like mine."    57 y.o. female returns for post-op check.  Doing well not have any pain she has been using olive oil and cocoa butter lotion on the scar  Review of Systems: Negative except as noted in the HPI. Denies N/V/F/Ch.   Objective:  There were no vitals filed for this visit. There is no height or weight on file to calculate BMI. Constitutional Well developed. Well nourished.  Vascular Foot warm and well perfused. Capillary refill normal to all digits.  Calf is soft and supple, no posterior calf or knee pain, negative Homans' sign  Neurologic Normal speech. Oriented to person, place, and time. Epicritic sensation to light touch grossly present bilaterally.  Dermatologic Incision well-healed not hypertrophic  Orthopedic: Tenderness to palpation noted about the surgical site.   Multiple view plain film radiographs: Good correction noted with hardware intact and in position, good consolidation across fusion site Assessment:   1. Status post surgery    Plan:  Patient was evaluated and treated and all questions answered.  S/p foot surgery right -Doing well and shows good consolidation across fusion site she may gradually transition out of the boot early next week and begin protected limited weightbearing in a supportive shoe.  I will see her back in 6 weeks for new radiographs.  Return in about 6 weeks (around 07/26/2023) for post op (new x-rays).

## 2023-06-14 NOTE — Telephone Encounter (Signed)
Patient called and left a message - she forgot to ask when she can drive.

## 2023-06-26 ENCOUNTER — Encounter: Payer: Self-pay | Admitting: Family Medicine

## 2023-06-26 ENCOUNTER — Ambulatory Visit: Payer: Medicare HMO | Admitting: Family Medicine

## 2023-06-26 VITALS — BP 168/88 | HR 65 | Temp 98.0°F | Resp 18 | Ht 65.0 in | Wt 193.0 lb

## 2023-06-26 DIAGNOSIS — F418 Other specified anxiety disorders: Secondary | ICD-10-CM

## 2023-06-26 DIAGNOSIS — J4489 Other specified chronic obstructive pulmonary disease: Secondary | ICD-10-CM | POA: Diagnosis not present

## 2023-06-26 DIAGNOSIS — Z1211 Encounter for screening for malignant neoplasm of colon: Secondary | ICD-10-CM

## 2023-06-26 DIAGNOSIS — R195 Other fecal abnormalities: Secondary | ICD-10-CM

## 2023-06-26 DIAGNOSIS — M1711 Unilateral primary osteoarthritis, right knee: Secondary | ICD-10-CM

## 2023-06-26 DIAGNOSIS — Z87891 Personal history of nicotine dependence: Secondary | ICD-10-CM

## 2023-06-26 DIAGNOSIS — I10 Essential (primary) hypertension: Secondary | ICD-10-CM | POA: Diagnosis not present

## 2023-06-26 MED ORDER — ARIPIPRAZOLE 2 MG PO TABS: 1.0000 mg | ORAL_TABLET | Freq: Every day | ORAL | 3 refills | Status: DC

## 2023-06-26 NOTE — Assessment & Plan Note (Addendum)
Currently smoking less than a pack a day.  Reviewed [place] quit now.  She is interested in lung cancer screening.

## 2023-06-26 NOTE — Assessment & Plan Note (Signed)
She has never seen orthopedist about her right knee pain.  The knee is locking but not giving out.  She has a little edema and no warmth.

## 2023-06-26 NOTE — Assessment & Plan Note (Signed)
She has a smoker's cough.  Will get her back on Advair 150/50 and see if that helps.

## 2023-06-26 NOTE — Assessment & Plan Note (Addendum)
PHQ-9 was #18 and GAD 7 total score was 17.  She is taking Paxil 40 mg a day she used to be on duloxetine 30 mg 2 p.o. daily she has never taken Abilify discussed starting Abilify 2 mg and continuing her paxil.  Will see her monthly follow-up on her elevated PHQ and GAD-7.  She denies suicidal or homicidal ideation

## 2023-06-26 NOTE — Progress Notes (Signed)
Established Patient Office Visit  Subjective   Patient ID: Cindy Grant, female    DOB: March 06, 1966  Age: 57 y.o. MRN: [id number]  Chief Complaint  Patient presents with   Medical Management of Chronic Issues    HPI Blood pressure is very elevated today 179/89.  She reported she was taking doxycycline levothyroxine 150 and Paxil 40 mg.  Called the pharmacy and she is supposed to be on losartan 50 mg and duloxetine 30 mg 2 a day.  No chest pain, no shortness of breath, no headache.  She does not check her blood pressure at home.  Will restart her losartan 50 mg today She did get her Cologuard.  Will call for results.  She was on Advair 150/50 for her COPD but has not had this in several months.  She still coughing up mostly white or gray stuff.  Continues to smoke but it less than a pack a day. Discussed smoking cessation in terms of its meaning to her.  Talked her about [place] quit now. She is having trouble with her night knee that gets stuck she has trouble with full extension.  It is very painful at times.  No edema or warmth that she has noticed.  It has not given out but it does feel like it locks up.  She would like to go to [facility] for that.    ROS    Objective:     BP (!) 168/88 (BP Location: Left Arm, Patient Position: Sitting, Cuff Size: Normal)   Pulse 65   Temp 98 F (36.7 C) (Oral)   Resp 18   Ht 5\' 5"  (1.651 m)   Wt 193 lb (87.5 kg)   SpO2 95%   BMI 32.12 kg/m    Physical Exam Vitals and nursing note reviewed.  Constitutional:      Appearance: Normal appearance.  HENT:     Head: Normocephalic and atraumatic.  Eyes:     Conjunctiva/sclera: Conjunctivae normal.  Cardiovascular:     Rate and Rhythm: Normal rate and regular rhythm.  Pulmonary:     Effort: Pulmonary effort is normal.     Breath sounds: Normal breath sounds.  Musculoskeletal:     Right lower leg: No edema.     Left lower leg: No edema.  Skin:    General: Skin is warm and dry.   Neurological:     Mental Status: She is alert and oriented to person, place, and time.  Psychiatric:        Mood and Affect: Mood normal.        Behavior: Behavior normal.        Thought Content: Thought content normal.        Judgment: Judgment normal.          No results found for any visits on 06/26/23.    The 10-year ASCVD risk score (Arnett DK, et al., 2019) is: 18.1%    Assessment & Plan:  Primary osteoarthritis of right knee Assessment & Plan: She has never seen orthopedist about her right knee pain.  The knee is locking but not giving out.  She has a little edema and no warmth.  Orders: -     Ambulatory referral to Orthopedic Surgery  Asthma with COPD [facility]) Assessment & Plan: She has a smoker's cough.  Will get her back on Advair 150/50 and see if that helps.   Colon cancer screening  Depression with anxiety Assessment & Plan: PHQ-9 was #18 and GAD 7  total score was 17.  She is taking Paxil 40 mg a day she used to be on duloxetine 30 mg 2 p.o. daily she has never taken Abilify discussed starting Abilify 2 mg and continuing her paxil.  Will see her monthly follow-up on her elevated PHQ and GAD-7.  She denies suicidal or homicidal ideation   Essential hypertension Assessment & Plan: Blood pressure is very elevated today at 179/89.  She has gotten off her losartan 50.  Follow-up in a week for blood pressure check.   History of smoking Assessment & Plan: Currently smoking less than a pack a day.  Reviewed [place] quit now.  She is interested in lung cancer screening.    Orders: -     CT CHEST LUNG CANCER SCREENING LOW DOSE WO CONTRAST; Future  Screening for colorectal cancer -     Ambulatory referral to Gastroenterology  Positive colorectal cancer screening using Cologuard test -     Ambulatory referral to Gastroenterology  Other orders -     ARIPiprazole; Take 0.5 tablets (1 mg total) by mouth daily.  Dispense: 45 tablet; Refill: 3     Return in about  1 week (around 07/03/2023), or Recheck BP, for BP recheck.    Alease Medina, MD

## 2023-06-26 NOTE — Assessment & Plan Note (Signed)
Blood pressure is very elevated today at 179/89.  She has gotten off her losartan 50.  Follow-up in a week for blood pressure check.

## 2023-06-27 ENCOUNTER — Telehealth: Payer: Self-pay

## 2023-06-27 ENCOUNTER — Other Ambulatory Visit: Payer: Self-pay

## 2023-06-27 DIAGNOSIS — R195 Other fecal abnormalities: Secondary | ICD-10-CM

## 2023-06-27 DIAGNOSIS — Z1211 Encounter for screening for malignant neoplasm of colon: Secondary | ICD-10-CM

## 2023-06-27 MED ORDER — NA SULFATE-K SULFATE-MG SULF 17.5-3.13-1.6 GM/177ML PO SOLN: 1.0000 | Freq: Once | ORAL | 0 refills | Status: AC

## 2023-06-27 NOTE — Telephone Encounter (Signed)
 Gastroenterology Pre-Procedure Review  Request Date: 07/12/23 Requesting Physician: Dr. Therisa  PATIENT REVIEW QUESTIONS: The patient responded to the following health history questions as indicated:    1. Are you having any GI issues? no positive cologuard, 1st colonoscopy 2. Do you have a personal history of Polyps? no 3. Do you have a family history of Colon Cancer or Polyps? no 4. Diabetes Mellitus? no 5. Joint replacements in the past 12 months?no 6. Major health problems in the past 3 months?no 7. Any artificial heart valves, MVP, or defibrillator?no    MEDICATIONS & ALLERGIES:    Patient reports the following regarding taking any anticoagulation/antiplatelet therapy:   Plavix, Coumadin, Eliquis, Xarelto, Lovenox, Pradaxa, Brilinta, or Effient? no Aspirin ? no  Patient confirms/reports the following medications:  Current Outpatient Medications  Medication Sig Dispense Refill   doxycycline  (VIBRA -TABS) 100 MG tablet Take 1 tablet (100 mg total) by mouth 2 (two) times daily. (Patient not taking: Reported on 05/10/2023) 10 tablet 0   ARIPiprazole  (ABILIFY ) 2 MG tablet Take 0.5 tablets (1 mg total) by mouth daily. 45 tablet 3   gabapentin  (NEURONTIN ) 300 MG capsule Take 1 capsule (300 mg total) by mouth 3 (three) times daily for 7 days. 21 capsule 0   levothyroxine  (SYNTHROID , LEVOTHROID) 150 MCG tablet Take 150 mcg by mouth daily before breakfast.     losartan  (COZAAR ) 50 MG tablet Take by mouth.     losartan  (COZAAR ) 50 MG tablet Take 50 mg by mouth daily.     PARoxetine (PAXIL) 40 MG tablet Take 40 mg by mouth every morning.     No current facility-administered medications for this visit.    Patient confirms/reports the following allergies:  Allergies  Allergen Reactions   Hydrocodone Itching   Oxycodone Itching   Penicillins Hives    No orders of the defined types were placed in this encounter.   AUTHORIZATION INFORMATION Primary Insurance: 1D#: Group  #:  Secondary Insurance: 1D#: Group #:  SCHEDULE INFORMATION: Date: 07/12/23 Time: Location: [facility]

## 2023-06-27 NOTE — Progress Notes (Signed)
 Gastroenterology Pre-Procedure Review  Request Date: 07/12/23 Requesting Physician: Dr. Jinny  PATIENT REVIEW QUESTIONS: The patient responded to the following health history questions as indicated:    1. Are you having any GI issues? no  2. Do you have a personal history of Polyps? no 3. Do you have a family history of Colon Cancer or Polyps? no 4. Diabetes Mellitus? no 5. Joint replacements in the past 12 months?no 6. Major health problems in the past 3 months?no 7. Any artificial heart valves, MVP, or defibrillator?no    MEDICATIONS & ALLERGIES:    Patient reports the following regarding taking any anticoagulation/antiplatelet therapy:   Plavix, Coumadin, Eliquis, Xarelto, Lovenox, Pradaxa, Brilinta, or Effient? no Aspirin ? no  Patient confirms/reports the following medications:  Current Outpatient Medications  Medication Sig Dispense Refill   doxycycline  (VIBRA -TABS) 100 MG tablet Take 1 tablet (100 mg total) by mouth 2 (two) times daily. (Patient not taking: Reported on 05/10/2023) 10 tablet 0   ARIPiprazole  (ABILIFY ) 2 MG tablet Take 0.5 tablets (1 mg total) by mouth daily. 45 tablet 3   gabapentin  (NEURONTIN ) 300 MG capsule Take 1 capsule (300 mg total) by mouth 3 (three) times daily for 7 days. 21 capsule 0   levothyroxine  (SYNTHROID , LEVOTHROID) 150 MCG tablet Take 150 mcg by mouth daily before breakfast.     losartan  (COZAAR ) 50 MG tablet Take by mouth.     losartan  (COZAAR ) 50 MG tablet Take 50 mg by mouth daily.     PARoxetine (PAXIL) 40 MG tablet Take 40 mg by mouth every morning.     No current facility-administered medications for this visit.    Patient confirms/reports the following allergies:  Allergies  Allergen Reactions   Hydrocodone Itching   Oxycodone Itching   Penicillins Hives    No orders of the defined types were placed in this encounter.   AUTHORIZATION INFORMATION Primary Insurance: 1D#: Group #:  Secondary Insurance: 1D#: Group  #:  SCHEDULE INFORMATION: Date:  Time: Location:

## 2023-07-03 ENCOUNTER — Ambulatory Visit: Payer: Medicare HMO | Admitting: Family Medicine

## 2023-07-12 ENCOUNTER — Ambulatory Visit: Admit: 2023-07-12 | Payer: Medicare HMO | Admitting: Gastroenterology

## 2023-07-12 SURGERY — COLONOSCOPY WITH PROPOFOL
Anesthesia: General

## 2023-07-26 ENCOUNTER — Ambulatory Visit (INDEPENDENT_AMBULATORY_CARE_PROVIDER_SITE_OTHER): Payer: Medicare HMO | Admitting: Podiatry

## 2023-07-26 ENCOUNTER — Encounter: Payer: Self-pay | Admitting: Podiatry

## 2023-07-26 ENCOUNTER — Ambulatory Visit (INDEPENDENT_AMBULATORY_CARE_PROVIDER_SITE_OTHER): Payer: Medicare HMO

## 2023-07-26 VITALS — Ht 65.0 in | Wt 193.0 lb

## 2023-07-26 DIAGNOSIS — M2011 Hallux valgus (acquired), right foot: Secondary | ICD-10-CM | POA: Diagnosis not present

## 2023-07-26 DIAGNOSIS — M21612 Bunion of left foot: Secondary | ICD-10-CM

## 2023-07-26 DIAGNOSIS — Z9889 Other specified postprocedural states: Secondary | ICD-10-CM

## 2023-07-26 DIAGNOSIS — M2012 Hallux valgus (acquired), left foot: Secondary | ICD-10-CM | POA: Diagnosis not present

## 2023-07-26 DIAGNOSIS — M19072 Primary osteoarthritis, left ankle and foot: Secondary | ICD-10-CM | POA: Diagnosis not present

## 2023-07-26 NOTE — Progress Notes (Signed)
Subjective:  Patient ID: Cindy Grant, female    DOB: 04-23-1966,  MRN: [id number]  Chief Complaint  Patient presents with   Routine Post Op    Pt is here for routine post op after surgery to right foot, pt states her foot is better, hurts a little after a long day but other than that no complaints.    58 y.o. female returns for post-op check.  She is doing well some occasional pain and cramping in the arch but seems to be improving.  Her left foot bunion is painful now again and would like to proceed with surgery on this  Review of Systems: Negative except as noted in the HPI. Denies N/V/F/Ch.   Objective:  There were no vitals filed for this visit. Body mass index is 32.12 kg/m. Constitutional Well developed. Well nourished.  Vascular Foot warm and well perfused. Capillary refill normal to all digits.  Calf is soft and supple, no posterior calf or knee pain, negative Homans' sign  Neurologic Normal speech. Oriented to person, place, and time. Epicritic sensation to light touch grossly present bilaterally.  Dermatologic Incision well-healed not hypertrophic  Orthopedic: No pain and minimal at the fusion site on the right foot.  Mild tenderness in the abductor and FHB.  On her left foot she has hallux valgus deformity with pain on the medial eminence   Multiple view plain film radiographs: Right foot x-rays today show maintained alignment and consolidation no complication of hardware has complete fusion at this point   previous left foot radiographs taken in August 2024 show hallux valgus deformity with mild degenerative changes in the first MTP joint  Assessment:   1. Status post surgery   2. Hallux valgus with bunions, left   3. Osteoarthritis of first metatarsophalangeal (MTP) joint of left foot    Plan:  Patient was evaluated and treated and all questions answered.  S/p foot surgery right -Regarding her right foot surgery this is fully healed she may continue regular  shoe gear and activity as tolerated the muscle cramping spasm she is having is added patient to the arthrodesis and I expect will resolve with time and activity and conditioning.  Follow-up with me as needed for the right foot if there are issues.  Regarding her left foot we again discussed the etiology and treatment including surgical and non surgical treatment for painful bunion.  She has exhausted all non surgical treatment prior to this visit including shoe gear changes and padding.  She desires surgical intervention and has done well with the right foot surgical intervention. We discussed all risks including but not limited to: pain, swelling, infection, scar, numbness which may be temporary or permanent, chronic pain, stiffness, nerve pain or damage, wound healing problems, bone healing problems including delayed or non-union and recurrence. Specifically we discussed the following procedures: Left foot first MTP fusion with bone graft from the heel. Informed consent was signed today. Surgery will be scheduled at a mutually agreeable date. Information regarding this will be forwarded to our surgery scheduler. In the interim until surgery I recommended utilizing as wide of shoes as possible, take NSAIDs or tylenol as tolerated for pain, and a bunion padding shield which can be purchased online    Surgical plan:  Procedure: -First MTP fusion with bone graft from heel  Location: -[facility]  Anesthesia plan: -Sedation with regional block  Postoperative pain plan: - Tylenol 1000 mg every 6 hours, ibuprofen 600 mg every 6 hours, gabapentin 300 mg every  8 hours x5 days, tramadol 50 mg tabs every 6 hours only as needed  DVT prophylaxis: -Aspirin 325 mg twice daily  WB Restrictions / DME needs: -NWB in knee scooter

## 2023-08-07 ENCOUNTER — Telehealth: Payer: Self-pay | Admitting: Podiatry

## 2023-08-07 NOTE — Telephone Encounter (Signed)
 DOS-09/08/23   HALLUX MPJ FUSION [id number] BONE GRAFT [id number]  [employer] EFFECTIVE DATE- 10/26/22  PER THE [place] PORTAL, PRIOR AUTH IS NOT REQUIRED FOR CPT CODE 45409. FOR CPT CODE 81191 PRIOR AUTH HAS BEEN APPROVED VIA [employer], GOOD FROM 09/08/23 - 12/09/23.  Authorization #[id number]  Tracking [id number]

## 2023-08-14 NOTE — Telephone Encounter (Signed)
 Copied from CRM [id number]. Topic: General - Other >> Aug 10, 2023  9:55 AM Shelah Lewandowsky wrote: Reason for CRM: Cindy Grant with Medicaid calling- reference number [phone]- needs Dr. Girtha Rm or office Medicaid ID for [facility]- [phone]

## 2023-08-21 ENCOUNTER — Telehealth: Payer: Self-pay

## 2023-08-21 NOTE — Telephone Encounter (Signed)
 Reached out to [facility] [phone] and then was directed to call [phone] and in turn was told to call back to the [phone], rep [place] gave me a ref number of: [phone] and stated as far as she could see they had already acquired the information needed.

## 2023-08-23 ENCOUNTER — Other Ambulatory Visit: Payer: Self-pay | Admitting: Family Medicine

## 2023-08-23 NOTE — Telephone Encounter (Signed)
 Copied from CRM [id number]. Topic: Clinical - Medication Refill >> Aug 23, 2023 12:36 PM Higinio Roger wrote: Most Recent Primary Care Visit:   Medication:  Duloxetine (Cymbalta, Drizalma Sprinkle) HCL DR 30 MG CAPSULE. 2 CAPSULES DAILY   Has the patient contacted their pharmacy? Yes (Agent: If no, request that the patient contact the pharmacy for the refill. If patient does not wish to contact the pharmacy document the reason why and proceed with request.)  (Agent: If yes, when and what did the pharmacy advise?) Pharmacy stated they sent request and have not heard anything  Is this the correct pharmacy for this prescription? Yes If no, delete pharmacy and type the correct one.  This is the patient's preferred pharmacy:   [facility] [address] [place] - [address] [place] [address] Phone: [phone] Fax: [phone]   Has the prescription been filled recently? No  Is the patient out of the medication? No.   Has the patient been seen for an appointment in the last year OR does the patient have an upcoming appointment? Yes  Can we respond through MyChart? No. [phone]  Agent: Please be advised that Rx refills may take up to 3 business days. We ask that you follow-up with your pharmacy.

## 2023-08-23 NOTE — Telephone Encounter (Signed)
 Copied from CRM [id number]. Topic: Clinical - Medication Refill >> Aug 23, 2023 12:20 PM Higinio Roger wrote: Most Recent Primary Care Visit:   Medication:   levothyroxine (SYNTHROID, LEVOTHROID) 150 MCG tablet  losartan (COZAAR) 50 MG tablet   ARIPiprazole (ABILIFY) 2 MG tablet   Has the patient contacted their pharmacy? No (Agent: If no, request that the patient contact the pharmacy for the refill. If patient does not wish to contact the pharmacy document the reason why and proceed with request.) (Agent: If yes, when and what did the pharmacy advise?) Pharmacy stated they sent request but no on responded Is this the correct pharmacy for this prescription? Yes If no, delete pharmacy and type the correct one.  This is the patient's preferred pharmacy:   [facility] [address] [place] - [address] [place] [address] Phone: [phone] Fax: [phone]   Has the prescription been filled recently? No  Is the patient out of the medication? Yes  Has the patient been seen for an appointment in the last year OR does the patient have an upcoming appointment? Yes  Can we respond through MyChart? No. Call: [phone]  Agent: Please be advised that Rx refills may take up to 3 business days. We ask that you follow-up with your pharmacy.

## 2023-08-28 ENCOUNTER — Telehealth: Payer: Self-pay

## 2023-08-28 DIAGNOSIS — I10 Essential (primary) hypertension: Secondary | ICD-10-CM

## 2023-08-28 DIAGNOSIS — E039 Hypothyroidism, unspecified: Secondary | ICD-10-CM

## 2023-08-28 MED ORDER — LOSARTAN POTASSIUM 50 MG PO TABS: 50.0000 mg | ORAL_TABLET | Freq: Every day | ORAL | 0 refills | Status: DC

## 2023-08-28 MED ORDER — LEVOTHYROXINE SODIUM 150 MCG PO TABS: 150.0000 ug | ORAL_TABLET | Freq: Every day | ORAL | 0 refills | Status: DC

## 2023-08-28 NOTE — Telephone Encounter (Signed)
 Copied from CRM [id number]. Topic: Clinical - Prescription Issue >> Aug 28, 2023  1:55 PM Elle L wrote: Reason for CRM: The patient is requesting an update on her Duloxetine (Cymbalta, Drizalma Sprinkle) HCL DR 30 MG CAPSULE, 2 CAPSULES DAIL, levothyroxine (SYNTHROID, LEVOTHROID) 150 MCG tablet, losartan (COZAAR) 50 MG tablet and ARIPiprazole (ABILIFY) 2 MG tablet refill request as this her second week without Levothyroxine. I did confirm that she is not currently having symptoms that would need to be triaged. The patient's call back number is [phone].   [facility] [address] [place] - [address] [place] [address] Phone: [phone] Fax: [phone]

## 2023-08-28 NOTE — Telephone Encounter (Signed)
 [facility]. Pt will need an office visit for further refills.

## 2023-08-29 ENCOUNTER — Ambulatory Visit: Payer: Self-pay | Admitting: Family Medicine

## 2023-08-29 ENCOUNTER — Ambulatory Visit: Admitting: Family Medicine

## 2023-08-29 NOTE — Telephone Encounter (Unsigned)
 Copied from CRM [id number]. Topic: Clinical - Red Word Triage >> Aug 29, 2023 12:37 PM Marland Kitchen D wrote: Red Word that prompted transfer to Nurse Triage: Patient said her depression is getting worse

## 2023-08-29 NOTE — Telephone Encounter (Signed)
 Chief Complaint: depression Symptoms: depression, insomnia, temper Frequency: exacerbation of chronic mental health issues Pertinent Negatives: Patient denies SI or HI, feeling unsafe Disposition: [] ED /[] Urgent Care (no appt availability in office) / [x] Appointment(In office/virtual)/ []  [facility]/ [] Home Care/ [] Refused Recommended Disposition /[] [place] [facility]/ []  Follow-up with PCP Additional Notes: Pt reports worsening depression. Pt endorses feelings of worthlessness, states "everybody's taking care of me." Pt endorses feeling down, states she is spending more money than is typical for her ([employer]). Denies spending money on dangerous items or experiences. Denies alcohol and drug use other than marijuana she purchases at the [facility]. States she lives with her grandkids and her temper is worse. States her grandkids misbehave, adds she often goes to her room for space from them. Denies thoughts of hurting herself or hurting others. States she feels safe. States "my kids take great care of me." States she has a healthy support system. Endorses sleeplessness, worse for the last month. States she feels her medications may need adjusting. States she is taking Abilify but believes she may be out. Per protocol, pt scheduled today in office at 2:50. RN advised pt she needs to go to the ED if she has thoughts of hurting herself or hurting others, and that she should call us back if anything changes or worsens before her appt. She verbalized understanding.   Copied from CRM [id number]. Topic: Clinical - Red Word Triage >> Aug 29, 2023 12:37 PM Marland Kitchen D wrote: Red Word that prompted transfer to Nurse Triage: Patient said her depression is getting worse Reason for Disposition  [1] Depression AND [2] worsening (e.g., sleeping poorly, less able to do activities of daily living)  Answer Assessment - Initial Assessment Questions 1. CONCERN: "What happened that made you call today?"     Pt  needing to make appt regarding future prescriptions 2. DEPRESSION SYMPTOM SCREENING: "How are you feeling overall?" (e.g., decreased energy, increased sleeping or difficulty sleeping, difficulty concentrating, feelings of sadness, guilt, hopelessness, or worthlessness)     Temper, "feel depressed", "I'm down", spending money. "Me and [employer] are best friends." Endorses worthlessness, "everybody's taking care of me" Denies that temper makes others unsafe - "it's hard to explain but anyone that is a parent might understand, grandkids get out of hand, it's a mess, sometimes I just go to my room" 3. RISK OF HARM - SUICIDAL IDEATION:  "Do you ever have thoughts of hurting or killing yourself?"  (e.g., yes, no, no but preoccupation with thoughts about death)   - INTENT:  "Do you have thoughts of hurting or killing yourself right NOW?" (e.g., yes, no, N/A)   - PLAN: "Do you have a specific plan for how you would do this?" (e.g., gun, knife, overdose, no plan, N/A)     No thoughts of hurting herself or others, states she has never had these thoughts, feels safe at home 4. RISK OF HARM - HOMICIDAL IDEATION:  "Do you ever have thoughts of hurting or killing someone else?"  (e.g., yes, no, no but preoccupation with thoughts about death)   - INTENT:  "Do you have thoughts of hurting or killing someone right NOW?" (e.g., yes, no, N/A)   - PLAN: "Do you have a specific plan for how you would do this?" (e.g., gun, knife, no plan, N/A)      No 5. FUNCTIONAL IMPAIRMENT: "How have things been going for you overall? Have you had more difficulty than usual doing your normal daily activities?"  (e.g., better,  same, worse; self-care, school, work, interactions)     Still able to shower/bathe 6. SUPPORT: "Who is with you now?" "Who do you live with?" "Do you have family or friends who you can talk to?"      "My kids take great care of me" 7. THERAPIST: "Do you have a counselor or therapist? Name?"     No 8. STRESSORS:  "Has there been any new stress or recent changes in your life?"     "Grandkids are off the hook, I shouldn't have to get as angry as I do", states she lives with her grandkids. "When I first got on the drug I was good but now my anger and my depression symptoms are getting worse", "Maybe I need a mood stabilizer" (taking Abilify prescribed 12/30 - states she may be out), states she is no longer taking Paxil (12/30 office visit states she would continue that), endorses she is taking duloxetine 9. ALCOHOL USE OR SUBSTANCE USE (DRUG USE): "Do you drink alcohol or use any illegal drugs?"     "No alcohol use but I smoke marijuana at night" (get stuff from the [place]"), "I can't sleep", "I have had difficulty sleeping for quite a while but more noticeable in the last month, I have been up since 0200 this AM" 10. OTHER: "Do you have any other physical symptoms right now?" (e.g., fever)       No  Protocols used: Depression-A-AH

## 2023-08-30 ENCOUNTER — Encounter: Payer: Self-pay | Admitting: Family Medicine

## 2023-08-30 ENCOUNTER — Other Ambulatory Visit: Payer: Self-pay | Admitting: Family Medicine

## 2023-08-30 ENCOUNTER — Ambulatory Visit (INDEPENDENT_AMBULATORY_CARE_PROVIDER_SITE_OTHER): Admitting: Family Medicine

## 2023-08-30 VITALS — BP 154/82 | HR 54 | Ht 65.0 in | Wt 196.0 lb

## 2023-08-30 DIAGNOSIS — F32A Depression, unspecified: Secondary | ICD-10-CM

## 2023-08-30 DIAGNOSIS — I10 Essential (primary) hypertension: Secondary | ICD-10-CM | POA: Diagnosis not present

## 2023-08-30 DIAGNOSIS — F418 Other specified anxiety disorders: Secondary | ICD-10-CM

## 2023-08-30 DIAGNOSIS — R195 Other fecal abnormalities: Secondary | ICD-10-CM | POA: Diagnosis not present

## 2023-08-30 DIAGNOSIS — E039 Hypothyroidism, unspecified: Secondary | ICD-10-CM

## 2023-08-30 DIAGNOSIS — Z1211 Encounter for screening for malignant neoplasm of colon: Secondary | ICD-10-CM

## 2023-08-30 DIAGNOSIS — F419 Anxiety disorder, unspecified: Secondary | ICD-10-CM

## 2023-08-30 DIAGNOSIS — J4489 Other specified chronic obstructive pulmonary disease: Secondary | ICD-10-CM

## 2023-08-30 MED ORDER — BLOOD PRESSURE KIT DEVI: 1.0000 | Freq: Every day | 0 refills | Status: DC

## 2023-08-30 MED ORDER — LOSARTAN POTASSIUM 100 MG PO TABS: 100.0000 mg | ORAL_TABLET | Freq: Every day | ORAL | 1 refills | Status: DC

## 2023-08-30 MED ORDER — FLUTICASONE-SALMETEROL 100-50 MCG/ACT IN AEPB: 1.0000 | INHALATION_SPRAY | Freq: Two times a day (BID) | RESPIRATORY_TRACT | 3 refills | Status: DC

## 2023-08-30 MED ORDER — LEVOTHYROXINE SODIUM 150 MCG PO TABS: 150.0000 ug | ORAL_TABLET | Freq: Every day | ORAL | 3 refills | Status: AC

## 2023-08-30 NOTE — Assessment & Plan Note (Signed)
 Daughter in law does the cooking and puts salt in the food.Increase losartan to 1200mg  daily.  New prescription put in.

## 2023-08-30 NOTE — Assessment & Plan Note (Addendum)
 She is having a good day today but reports PHQ9 is 16 and GAD 7 is 20.  Denies SI and HI.  Has a very fast temper.  On Duloxetine 30mg  BID and lamictal 200mg .  Referral to psychiatry.

## 2023-08-30 NOTE — Assessment & Plan Note (Signed)
 Cologuard positive.  Referral to GI

## 2023-08-30 NOTE — Assessment & Plan Note (Signed)
 Lungs are clear today, just needs refill of meds.

## 2023-08-30 NOTE — Progress Notes (Signed)
 Established Patient Office Visit  Subjective   Patient ID: Cindy Grant, female    DOB: 1966-02-12  Age: 58 y.o. MRN: [id number]  Chief Complaint  Patient presents with   Depression    Depression       She reports that she is "having a great day today" but scored PHQ-9 16 and GAD-7 20.  She denies HI and SI.  She states the problem is her temper it is a split-second before she is angry she explodes and then removes herself from the situation by going to her room.  The problem with her tempers is usually directed at her grandchildren.  2 boys ages 109 and 56 who both have ADHD. She has been taking duloxetine DR 30 mg 2 a day Lamictal 100 mg 2 a day aripiprazole 1 mg daily.  She has not been taking the Paxil. She was supposed to be taking the aripiprazole and the Paxil but she got confused. She has been taking losartan 50 mg daily but her blood pressure is 154/80. She has not been taking her levothyroxine because it has not been filled. She has not been taking her Advair 100/50 because that was not filled.  She did get her referral to [facility] for her right knee. She has a positive Cologuard but did not go get her colonoscopy.  Advised we will put in a new referral for today.     Review of Systems  Psychiatric/Behavioral:  Positive for depression.       Objective:     BP (!) 154/82 (BP Location: Right Arm, Patient Position: Sitting, Cuff Size: Normal)   Pulse (!) 54   Ht 5\' 5"  (1.651 m)   Wt 196 lb (88.9 kg)   SpO2 97%   BMI 32.62 kg/m    Physical Exam Vitals reviewed.  Constitutional:      Appearance: Normal appearance.  HENT:     Head: Normocephalic.  Eyes:     General:        Right eye: No discharge.        Left eye: No discharge.  Cardiovascular:     Rate and Rhythm: Normal rate.  Pulmonary:     Effort: Pulmonary effort is normal.  Neurological:     Mental Status: She is alert and oriented to person, place, and time.  Psychiatric:        Mood and Affect:  Mood normal.        Behavior: Behavior normal.        Thought Content: Thought content normal.        Judgment: Judgment normal.          No results found for any visits on 08/30/23.    The 10-year ASCVD risk score (Arnett DK, et al., 2019) is: 15.4%    Assessment & Plan:  Positive colorectal cancer screening using Cologuard test -     Ambulatory referral to Gastroenterology  Anxiety and depression -     Ambulatory referral to Psychiatry  Asthma with COPD [facility]) Assessment & Plan: Lungs are clear today, just needs refill of meds.    Orders: -     Fluticasone-Salmeterol; Inhale 1 puff into the lungs 2 (two) times daily.  Dispense: 1 each; Refill: 3  Colon cancer screening Assessment & Plan: Cologuard positive.  Referral to GI   Depression with anxiety Assessment & Plan: She is having a good day today but reports PHQ9 is 16 and GAD 7 is 20.  Denies SI and  HI.  Has a very fast temper.  On Duloxetine 30mg  BID and lamictal 200mg .  Referral to psychiatry.     Essential hypertension Assessment & Plan: Daughter in law does the cooking and puts salt in the food.Increase losartan to 1200mg  daily.  New prescription put in.       Orders: -     Blood Pressure Kit; 1 each by Does not apply route daily.  Dispense: 1 each; Refill: 0 -     Losartan Potassium; Take 1 tablet (100 mg total) by mouth daily.  Dispense: 90 tablet; Refill: 1  Acquired hypothyroidism -     Levothyroxine Sodium; Take 1 tablet (150 mcg total) by mouth daily.  Dispense: 90 tablet; Refill: 3     Return in about 4 weeks (around 09/27/2023) for BP recheck.    Alease Medina, MD

## 2023-08-30 NOTE — Progress Notes (Unsigned)
   Established Patient Office Visit  Subjective   Patient ID: Cindy Grant, female    DOB: 05-06-1966  Age: 58 y.o. MRN: [id number]  No chief complaint on file.   HPI   {History (Optional):23778}  ROS    Objective:     There were no vitals taken for this visit. {Vitals History (Optional):23777}  Physical Exam   {Perform Simple Foot Exam  Perform Detailed exam:1} {Insert foot Exam (Optional):30965}   No results found for any visits on 08/30/23.  {Labs (Optional):23779}  The 10-year ASCVD risk score (Arnett DK, et al., 2019) is: 15.4%    Assessment & Plan:  There are no diagnoses linked to this encounter.   No follow-ups on file.    Alease Medina, MD

## 2023-09-01 ENCOUNTER — Other Ambulatory Visit: Payer: Self-pay | Admitting: Family Medicine

## 2023-09-01 NOTE — Telephone Encounter (Signed)
 Copied from CRM [id number]. Topic: Clinical - Medication Refill >> Sep 01, 2023  4:26 PM Franchot Heidelberg wrote: Most Recent Primary Care Visit:  Provider: Alease Medina  Department: [facility]  Visit Type: ACUTE  Date: 08/30/2023  Medication: DULoxetine (CYMBALTA) 30 MG capsule  Has the patient contacted their pharmacy? Yes (Agent: If no, request that the patient contact the pharmacy for the refill. If patient does not wish to contact the pharmacy document the reason why and proceed with request.) (Agent: If yes, when and what did the pharmacy advise?)  Is this the correct pharmacy for this prescription? Yes If no, delete pharmacy and type the correct one.  This is the patient's preferred pharmacy:  [facility] [address] [place] - [address] [place] [address] Phone: [phone] Fax: [phone]   Has the prescription been filled recently? Yes  Is the patient out of the medication? Yes  Has the patient been seen for an appointment in the last year OR does the patient have an upcoming appointment? Yes  Can we respond through MyChart? Yes  Agent: Please be advised that Rx refills may take up to 3 business days. We ask that you follow-up with your pharmacy.

## 2023-09-08 ENCOUNTER — Other Ambulatory Visit: Payer: Self-pay | Admitting: Podiatry

## 2023-09-08 DIAGNOSIS — M2012 Hallux valgus (acquired), left foot: Secondary | ICD-10-CM | POA: Diagnosis not present

## 2023-09-08 DIAGNOSIS — M89772 Major osseous defect, left ankle and foot: Secondary | ICD-10-CM | POA: Diagnosis not present

## 2023-09-08 MED ORDER — GABAPENTIN 300 MG PO CAPS: 300.0000 mg | ORAL_CAPSULE | Freq: Three times a day (TID) | ORAL | 0 refills | Status: DC

## 2023-09-08 MED ORDER — TRAMADOL HCL 50 MG PO TABS: 50.0000 mg | ORAL_TABLET | Freq: Four times a day (QID) | ORAL | 0 refills | Status: AC | PRN

## 2023-09-08 MED ORDER — ACETAMINOPHEN 500 MG PO TABS
1000.0000 mg | ORAL_TABLET | Freq: Four times a day (QID) | ORAL | 0 refills | Status: AC | PRN
Start: 2023-09-08 — End: 2023-09-22

## 2023-09-08 MED ORDER — IBUPROFEN 600 MG PO TABS: 600.0000 mg | ORAL_TABLET | Freq: Four times a day (QID) | ORAL | 0 refills | Status: AC | PRN

## 2023-09-08 MED ORDER — ASPIRIN 325 MG PO TBEC
325.0000 mg | DELAYED_RELEASE_TABLET | Freq: Two times a day (BID) | ORAL | 0 refills | Status: AC
Start: 2023-09-08 — End: 2023-10-08

## 2023-09-08 NOTE — Progress Notes (Signed)
 3/14 L MTP fusion

## 2023-09-12 ENCOUNTER — Encounter: Payer: Self-pay | Admitting: *Deleted

## 2023-09-13 ENCOUNTER — Encounter: Payer: Medicare HMO | Admitting: Podiatry

## 2023-09-13 ENCOUNTER — Encounter: Payer: Self-pay | Admitting: Podiatry

## 2023-09-13 ENCOUNTER — Ambulatory Visit (INDEPENDENT_AMBULATORY_CARE_PROVIDER_SITE_OTHER): Payer: Medicare HMO | Admitting: Podiatry

## 2023-09-13 ENCOUNTER — Ambulatory Visit (INDEPENDENT_AMBULATORY_CARE_PROVIDER_SITE_OTHER)

## 2023-09-13 DIAGNOSIS — M2012 Hallux valgus (acquired), left foot: Secondary | ICD-10-CM

## 2023-09-13 DIAGNOSIS — Z9889 Other specified postprocedural states: Secondary | ICD-10-CM

## 2023-09-13 NOTE — Progress Notes (Signed)
 She presents today for her first postop visit date of surgery was 14 2025 left first metatarsophalangeal joint fusion with bone graft from the heel.  She states that it hurts a little more than the right when did but is still not too bad.  She denies fever chills nausea vomiting muscle aches and pains.  Denies shortness of breath or chest pain.  She presents today with her boot on her knee scooter.  Dressed toe dressing intact once removed demonstrates minimal erythema mild edema no cellulitis drainage or odor all incision sites including lateral heel left and dorsal aspect first metatarsal phalangeal joint are healing nicely sutures are intact no purulence no malodor.  Radiographs taken today demonstrate an osseously mature individual with good bone mineralization and internal fixation to the first metatarsal phalangeal joint fusion site with plates and screws and interosseous intra-articular screw.  Assessment: Well-healing surgical foot.  Plan: Redressed today dressed a compressive dressing new cam boot dispensed.  Continue nonweightbearing status.  Keep clean and dry.  Follow-up with Dr. Lilian Kapur next week.

## 2023-09-27 ENCOUNTER — Other Ambulatory Visit: Payer: Self-pay | Admitting: Family Medicine

## 2023-09-27 ENCOUNTER — Ambulatory Visit (INDEPENDENT_AMBULATORY_CARE_PROVIDER_SITE_OTHER): Payer: Medicare HMO | Admitting: Podiatry

## 2023-09-27 ENCOUNTER — Encounter: Payer: Self-pay | Admitting: Podiatry

## 2023-09-27 ENCOUNTER — Ambulatory Visit (INDEPENDENT_AMBULATORY_CARE_PROVIDER_SITE_OTHER)

## 2023-09-27 DIAGNOSIS — M2012 Hallux valgus (acquired), left foot: Secondary | ICD-10-CM

## 2023-09-27 DIAGNOSIS — I10 Essential (primary) hypertension: Secondary | ICD-10-CM

## 2023-09-28 NOTE — Progress Notes (Signed)
  Subjective:  Patient ID: SAHASRA BELUE, female    DOB: [birth year],  MRN: [id number]  Chief Complaint  Patient presents with   Routine Post Op    POV # 2 DOS 09/08/23 --- LEFT FOOT BUNION CORRECTION WITH JOINT FUSION AND BONE GRAFT FROM HEEL" "It's pretty good.  I get those nerve shocks.  My pain level is a five."     58 y.o. female returns for post-op check.   Review of Systems: Negative except as noted in the HPI. Denies N/V/F/Ch.   Objective:  There were no vitals filed for this visit. There is no height or weight on file to calculate BMI. Constitutional Well developed. Well nourished.  Vascular Foot warm and well perfused. Capillary refill normal to all digits.  Calf is soft and supple, no posterior calf or knee pain, negative Homans' sign  Neurologic Normal speech. Oriented to person, place, and time. Epicritic sensation to light touch grossly present bilaterally.  Dermatologic Skin healing well without signs of infection. Skin edges well coapted without signs of infection.  Orthopedic: Tenderness to palpation noted about the surgical site.  Mild edema   Multiple view plain film radiographs: Good correction noted hardware intact and in good position Assessment:   1. Acquired hallux valgus of left foot    Plan:  Patient was evaluated and treated and all questions answered.  S/p foot surgery left -Progressing as expected post-operatively.  May begin weightbearing in boot initially with heel.  Okay to shower and drive.  Follow-up in 3 weeks for new x-rays   Return for post op (new x-rays).

## 2023-09-29 ENCOUNTER — Ambulatory Visit: Admitting: Family Medicine

## 2023-10-04 ENCOUNTER — Ambulatory Visit (INDEPENDENT_AMBULATORY_CARE_PROVIDER_SITE_OTHER): Admitting: Podiatry

## 2023-10-04 DIAGNOSIS — M2012 Hallux valgus (acquired), left foot: Secondary | ICD-10-CM

## 2023-10-04 NOTE — Progress Notes (Signed)
 Still had suture from Graft to site.  Removed uneventfully.  Continue weightbearing in boot return in 2 weeks for new x-rays

## 2023-10-16 ENCOUNTER — Other Ambulatory Visit: Payer: Self-pay | Admitting: *Deleted

## 2023-10-16 ENCOUNTER — Telehealth: Payer: Self-pay | Admitting: *Deleted

## 2023-10-16 DIAGNOSIS — Z1211 Encounter for screening for malignant neoplasm of colon: Secondary | ICD-10-CM

## 2023-10-16 DIAGNOSIS — R195 Other fecal abnormalities: Secondary | ICD-10-CM

## 2023-10-16 MED ORDER — NA SULFATE-K SULFATE-MG SULF 17.5-3.13-1.6 GM/177ML PO SOLN: 1.0000 | Freq: Once | ORAL | 0 refills | Status: AC

## 2023-10-16 NOTE — Telephone Encounter (Signed)
 Gastroenterology Pre-Procedure Review  Request Date: 11/09/2023 Requesting Physician: Dr. Ole Berkeley  PATIENT REVIEW QUESTIONS: The patient responded to the following health history questions as indicated:    1. Are you having any GI issues? no 2. Do you have a personal history of Polyps? no 3. Do you have a family history of Colon Cancer or Polyps? no 4. Diabetes Mellitus? no 5. Joint replacements in the past 12 months?no 6. Major health problems in the past 3 months?yes (foot surgery in the November 2024) 7. Any artificial heart valves, MVP, or defibrillator?no    MEDICATIONS & ALLERGIES:    Patient reports the following regarding taking any anticoagulation/antiplatelet therapy:   Plavix, Coumadin, Eliquis, Xarelto, Lovenox, Pradaxa, Brilinta, or Effient? no Aspirin ? no  Patient confirms/reports the following medications:  Current Outpatient Medications  Medication Sig Dispense Refill   Blood Pressure Monitoring (BLOOD PRESSURE KIT) DEVI 1 each by Does not apply route daily. 1 each 0   cetirizine (ZYRTEC) 10 MG tablet Take 10 mg by mouth daily.     DULoxetine (CYMBALTA) 30 MG capsule Take 30 mg by mouth daily.     fluticasone -salmeterol (ADVAIR) 100-50 MCG/ACT AEPB Inhale 1 puff into the lungs 2 (two) times daily. 1 each 3   gabapentin  (NEURONTIN ) 300 MG capsule Take 1 capsule (300 mg total) by mouth 3 (three) times daily for 7 days. 21 capsule 0   lamoTRIgine  (LAMICTAL ) 100 MG tablet Take 200 mg by mouth daily.     levothyroxine  (SYNTHROID ) 150 MCG tablet Take 1 tablet (150 mcg total) by mouth daily before breakfast. 30 tablet 0   levothyroxine  (SYNTHROID ) 150 MCG tablet Take 1 tablet (150 mcg total) by mouth daily. 90 tablet 3   losartan  (COZAAR ) 100 MG tablet Take 1 tablet (100 mg total) by mouth daily. 90 tablet 1   losartan  (COZAAR ) 50 MG tablet Take by mouth.     montelukast (SINGULAIR) 10 MG tablet Take 10 mg by mouth at bedtime.     No current facility-administered medications  for this visit.    Patient confirms/reports the following allergies:  Allergies  Allergen Reactions   Hydrocodone Itching   Oxycodone Itching   Penicillins Hives    No orders of the defined types were placed in this encounter.   AUTHORIZATION INFORMATION Primary Insurance: 1D#: Group #:  Secondary Insurance: 1D#: Group #:  SCHEDULE INFORMATION: Date:11/09/2023 Time: Location:  [facility]

## 2023-10-18 ENCOUNTER — Ambulatory Visit (INDEPENDENT_AMBULATORY_CARE_PROVIDER_SITE_OTHER)

## 2023-10-18 ENCOUNTER — Encounter: Payer: Self-pay | Admitting: Podiatry

## 2023-10-18 ENCOUNTER — Ambulatory Visit (INDEPENDENT_AMBULATORY_CARE_PROVIDER_SITE_OTHER): Payer: Medicare HMO | Admitting: Podiatry

## 2023-10-18 VITALS — Ht 65.0 in | Wt 196.0 lb

## 2023-10-18 DIAGNOSIS — M2012 Hallux valgus (acquired), left foot: Secondary | ICD-10-CM

## 2023-10-19 ENCOUNTER — Ambulatory Visit (INDEPENDENT_AMBULATORY_CARE_PROVIDER_SITE_OTHER): Admitting: Psychiatry

## 2023-10-19 ENCOUNTER — Encounter: Payer: Self-pay | Admitting: Psychiatry

## 2023-10-19 VITALS — BP 142/86 | HR 85 | Temp 98.3°F | Ht 65.0 in | Wt 188.0 lb

## 2023-10-19 DIAGNOSIS — F3181 Bipolar II disorder: Secondary | ICD-10-CM

## 2023-10-19 DIAGNOSIS — F4321 Adjustment disorder with depressed mood: Secondary | ICD-10-CM | POA: Diagnosis not present

## 2023-10-19 MED ORDER — LAMOTRIGINE 100 MG PO TABS: 200.0000 mg | ORAL_TABLET | Freq: Every day | ORAL | 0 refills | Status: DC

## 2023-10-19 NOTE — Progress Notes (Signed)
 Psychiatric Initial Adult Assessment   Patient Identification: Cindy Grant MRN:  [id number] Date of Evaluation:  10/19/2023 Referral Source: Dr. Susan Ziglar Chief Complaint:   Chief Complaint  Patient presents with   Establish Care   Visit Diagnosis:    ICD-10-CM   1. Bipolar 2 disorder (HCC)  F31.81 ARIPiprazole  (ABILIFY ) 2 MG tablet    lamoTRIgine  (LAMICTAL ) 100 MG tablet    2. 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  Patient was referred by Dr. Ziglar at the [facility] clinic stating that patient requires medication management for bipolar disorder.  Patient presents stating that she is agitated and having significant amount of irritability while taking care of her grandkids at home.  Patient states that she is hypersensitive to noise and states that she gets very upset whenever there is a lot of noise from the children and gets upset and takes it out on the children.  Verbally.  Patient reports that she also has uncontrollable crying due to the loss of her husband 5 years ago due to COVID.  Patient reports that whenever she is going about her day she will have uncontrollable crying fits throughout the day without explanation.  Patient also reports that she has 2 sons with 4 grand [place] kids that she helps take care of that she lives in the same household with one of her sons.  Patient currently denies SI, HI, AVH.  Based on this assessment interview it is recommended patient be diagnosed with bipolar type II disorder as well as complicated grieving.  Patient reports that she has been started on medication and when she is taking 2 mg of Abilify   daily, 200 mg of Lamictal  once daily, and duloxetine 60 mg once daily.  It is recommended for the patient to continue taking his medications as the patient started recently with the Lamictal .  Patient was highly emotional and distressed at this interview so is recommended for the patient to follow up in 2 weeks to monitor medication therapeutic effects as well as symptoms with adjustments.  Patient will follow up in 2 weeks.  Associated Signs/Symptoms: Depression Symptoms:  anhedonia, insomnia, fatigue, feelings of worthlessness/guilt, difficulty concentrating, hopelessness, anxiety, (Hypo) Manic Symptoms:  Elevated Mood, Grandiosity, Impulsivity, Irritable Mood, Labiality of Mood, Anxiety Symptoms:  Excessive Worry, Psychotic Symptoms: Negative PTSD Symptoms: Had a traumatic exposure:  Loss of her husband in 2021 due to COVID.  Past Psychiatric History:   Previous Psych Hospitalizations: Denies  Outpatient treatment: Psychotropic medications started by PCP  Medications Current: - Abilify  2 mg once a day - Lamictal  200 mg once a day - Duloxetine 60 mg once a day  Medication Trials: - Unable to recall previous medication trials  Suicide & Violence: - Denies SI, HI, AVH.  Denies having a firearm in the home. Psychotherapy: - Patient recommended for psychotherapy for complicated grieving and has been placed on Christies Tina's wait list here at [facility] Legal: - denies  Previous Psychotropic Medications: Yes   Substance Abuse History in the last 12 months:  No.  Consequences of Substance Abuse: Negative  Past Medical History:  Past Medical History:  Diagnosis Date   Anxiety    Asthma    Depression    Nicotine dependence    Smoked 30 years +.  Agrees to lung cancer screening.   Thyroid disease     Past Surgical History:  Procedure Laterality Date   ABDOMINAL HYSTERECTOMY     KNEE SURGERY Left    TONSILLECTOMY      Family Psychiatric History: Reports that  brother has bipolar disorder as well as a son that also has bipolar disorder. Family History: History reviewed. No pertinent family history.  Social History:   Social History   Socioeconomic History   Marital status: Widowed    Spouse name: Not on file   Number of children: 2   Years of education: Not on file   Highest education level: Some college, no degree  Occupational History   Not on file  Tobacco Use   Smoking status: Every Day    Current packs/day: 0.25    Types: Cigarettes, E-cigarettes    Passive exposure: Current   Smokeless tobacco: Never   Tobacco comments:    Vape - daily 02/08/2023  Vaping Use   Vaping status: Every Day   Substances: THC, Flavoring  Substance and Sexual Activity   Alcohol use: Not Currently    Comment: occasionally   Drug use: Not Currently    Comment: thc   Sexual activity: Not Currently  Other Topics Concern   Not on file  Social History Narrative   Not on file   Social Drivers of Health   Financial Resource Strain: Not on file  Food Insecurity: Not on file  Transportation Needs: Not on file  Physical Activity: Not on file  Stress: Not on file  Social Connections: Not on file    Additional Social History: No additional history  Allergies:   Allergies  Allergen Reactions   Hydrocodone Itching   Oxycodone Itching   Penicillins Hives    Metabolic Disorder Labs: No results found for: "HGBA1C", "MPG" No results found for: "PROLACTIN" No results found for: "CHOL", "TRIG", "HDL", "CHOLHDL", "VLDL", "LDLCALC" No results found for: "TSH"  Therapeutic Level Labs: No results found for: "LITHIUM" No results found for: "CBMZ" No results found for: "VALPROATE"  Current Medications: Current Outpatient Medications  Medication Sig Dispense Refill   ARIPiprazole  (ABILIFY ) 2 MG tablet Take 1 mg by mouth daily.     Blood Pressure Monitoring (BLOOD PRESSURE KIT) DEVI 1 each by Does not apply route daily. 1 each 0   cetirizine  (ZYRTEC) 10 MG tablet Take 10 mg by mouth daily.     DULoxetine (CYMBALTA) 30 MG capsule Take 30 mg by mouth daily.     fluticasone -salmeterol (ADVAIR) 100-50 MCG/ACT AEPB Inhale 1 puff into the lungs 2 (two) times daily. 1 each 3   lamoTRIgine  (LAMICTAL ) 100 MG tablet Take 200 mg by mouth daily.     levothyroxine  (SYNTHROID ) 150 MCG tablet Take 1 tablet (150 mcg total) by mouth daily. 90 tablet 3   losartan  (COZAAR ) 100 MG tablet Take 1 tablet (100 mg total) by mouth daily. 90 tablet 1   montelukast (SINGULAIR) 10 MG tablet Take 10 mg by mouth at bedtime.     No current facility-administered medications for this visit.    Musculoskeletal: Strength & Muscle Tone: within normal limits Gait & Station: normal Patient leans: Backward  Psychiatric Specialty Exam: Review of Systems  Constitutional: Negative.   HENT: Negative.    Eyes: Negative.   Respiratory: Negative.    Cardiovascular:  Negative.   Gastrointestinal: Negative.   Endocrine: Negative.   Genitourinary: Negative.   Musculoskeletal: Negative.   Allergic/Immunologic: Negative.   Neurological: Negative.   Hematological: Negative.   Psychiatric/Behavioral:  Positive for agitation, behavioral problems, decreased concentration, dysphoric mood and sleep disturbance. The patient is nervous/anxious.     Blood pressure (!) 142/86, pulse 85, temperature 98.3 F (36.8 C), temperature source Temporal, height 5\' 5"  (1.651 m), weight 188 lb (85.3 kg), SpO2 96%.Body mass index is 31.28 kg/m.  General Appearance: Well Groomed  Eye Contact:  Good  Speech:  Clear and Coherent  Volume:  Increased  Mood:  Anxious, Depressed, and Irritable  Affect:  Depressed and Tearful  Thought Process:  Coherent  Orientation:  Full (Time, Place, and Person)  Thought Content:  Logical  Suicidal Thoughts:  No  Homicidal Thoughts:  No  Memory:  Immediate;   Good Recent;   Good Remote;   Good  Judgement:  Good  Insight:  Good  Psychomotor Activity:   Normal  Concentration:  Concentration: Good and Attention Span: Good  Recall:  Good  Fund of Knowledge:Good  Language: Good  Akathisia:  No  Handed:  Right  AIMS (if indicated):    Assets:  Desire for Improvement Financial Resources/Insurance Housing  ADL's:  Intact  Cognition: WNL  Sleep:  Poor   Screenings: GAD-7    Flowsheet Row Office Visit from 08/30/2023 in [facility] [place] Office Visit from 06/26/2023 in [facility]  Total GAD-7 Score 17 17      PHQ2-9    Flowsheet Row Office Visit from 08/30/2023 in [facility] Visit from 06/26/2023 in [facility] at [facility] Total Score 4 6  PHQ-9 Total Score 16 18       Assessment and Plan:  Assessment - Diagnosis: Bipolar 2 disorder (HCC) [F31.81]  Complicated grieving [F43.21]  Differential Diagnosis: PTSD  - Progress: Baseline appointment - Risk Factors: Worsening symptoms  Plan - Medications:  Continue taking Lamictal  200mg  once a day, prescribed by PCP, patient has been educated on rashes to monitor for new rashes and to notify the clinic should she develop a rash and to stop medication.  Continue Duloxetine 30mg  once daily, prescribed by PCP Abilify  2mg  once daily, prescribed by PCP, patient has been educated while taking medication to monitor her symptoms of new uncontrollable movements as well as tremors and to notify the clinic should that any of these symptoms occur. - Psychotherapy: Recommended for Psychotherapy with Kristina at the [place] office, placed on waitlist, approx availability in June - Education: Pt has been educated on medication purpose, side effect, and adverse reactions.   Pt has been educated on grieving and the recommendation for faith based community involvement and the benefits of therapy.  Pt has been educated on importance of medication compliance with bipolar type 2.   - Follow-Up: Pt will follow  up in 2 weeks. - Referrals: Pt referred to therapy at [facility] with Merritt Ables.  - Safety Planning:  The patient has been educated, if they should have suicidal thoughts with or without a plan to call 911, or go to the closest emergency department.  Pt verbalized understanding.  Pt denies firearms within the home.  Pt also agrees to call the clinic should they have worsening symptoms before the next appointment.      Patient/Guardian was advised Release of Information must be obtained prior to any record release  in order to collaborate their care with an outside provider. Patient/Guardian was advised if they have not already done so to contact the registration department to sign all necessary forms in order for us  to release information regarding their care.   Consent: Patient/Guardian gives verbal consent for treatment and assignment of benefits for services provided during this visit. Patient/Guardian expressed understanding and agreed to proceed.   Arlana Labor, NP 4/24/20258:44 AM

## 2023-10-19 NOTE — Progress Notes (Signed)
  Subjective:  Patient ID: Cindy Grant, female    DOB: [birth year],  MRN: [id number]  Chief Complaint  Patient presents with   Routine Post Op    Pt is here to f/u on left foot after surgery she states that's everything fells well and she has no complaints.     58 y.o. female returns for post-op check.   Review of Systems: Negative except as noted in the HPI. Denies N/V/F/Ch.   Objective:  There were no vitals filed for this visit. Body mass index is 32.62 kg/m. Constitutional Well developed. Well nourished.  Vascular Foot warm and well perfused. Capillary refill normal to all digits.  Calf is soft and supple, no posterior calf or knee pain, negative Homans' sign  Neurologic Normal speech. Oriented to person, place, and time. Epicritic sensation to light touch grossly present bilaterally.  Dermatologic Incision is well-healed and not hypertrophic  Orthopedic: Little to no pain to palpation noted about the surgical site.  Mild edema   Multiple view plain film radiographs: Doing well no complication of hardware complete consolidation across fusion site Assessment:   1. Acquired hallux valgus of left foot    Plan:  Patient was evaluated and treated and all questions answered.  S/p foot surgery left - Doing well has near complete consolidation no complication of hardware.  Return to shoe gear as tolerated.  Follow-up in 6 weeks for final x-rays.   Return in about 6 weeks (around 11/29/2023) for post op (new x-rays).

## 2023-11-02 ENCOUNTER — Other Ambulatory Visit: Payer: Self-pay | Admitting: Medical Genetics

## 2023-11-02 ENCOUNTER — Encounter (HOSPITAL_COMMUNITY): Payer: Self-pay

## 2023-11-03 ENCOUNTER — Other Ambulatory Visit

## 2023-11-08 ENCOUNTER — Encounter: Payer: Self-pay | Admitting: Gastroenterology

## 2023-11-09 ENCOUNTER — Telehealth: Payer: Self-pay | Admitting: *Deleted

## 2023-11-09 ENCOUNTER — Encounter
Admission: RE | Disposition: A | Discharge status: Home / Self Care | Payer: Self-pay | Source: Home / Self Care | Attending: Gastroenterology

## 2023-11-09 ENCOUNTER — Ambulatory Visit
Admission: RE | Admit: 2023-11-09 | Discharge: 2023-11-09 | Disposition: A | Discharge status: Home / Self Care | Attending: Gastroenterology | Admitting: Gastroenterology

## 2023-11-09 ENCOUNTER — Encounter: Payer: Self-pay | Admitting: Anesthesiology

## 2023-11-09 ENCOUNTER — Encounter: Payer: Self-pay | Admitting: Gastroenterology

## 2023-11-09 DIAGNOSIS — Z5309 Procedure and treatment not carried out because of other contraindication: Secondary | ICD-10-CM | POA: Diagnosis not present

## 2023-11-09 DIAGNOSIS — R195 Other fecal abnormalities: Secondary | ICD-10-CM | POA: Diagnosis not present

## 2023-11-09 DIAGNOSIS — Z1211 Encounter for screening for malignant neoplasm of colon: Secondary | ICD-10-CM

## 2023-11-09 HISTORY — DX: Repeated falls: R29.6

## 2023-11-09 HISTORY — DX: Essential (primary) hypertension: I10

## 2023-11-09 HISTORY — DX: Chronic obstructive pulmonary disease, unspecified: J44.9

## 2023-11-09 HISTORY — DX: Vertebro-basilar artery syndrome: G45.0

## 2023-11-09 HISTORY — DX: Hypothyroidism, unspecified: E03.9

## 2023-11-09 HISTORY — PX: COLONOSCOPY: SHX5424

## 2023-11-09 HISTORY — DX: Unspecified osteoarthritis, unspecified site: M19.90

## 2023-11-09 SURGERY — COLONOSCOPY
Anesthesia: General

## 2023-11-09 MED ORDER — SODIUM CHLORIDE 0.9 % IV SOLN: INTRAVENOUS | Status: DC

## 2023-11-09 NOTE — Telephone Encounter (Signed)
 OR Nursing by Ole Berkeley, RN at 11/09/2023  8:05 AM Version 1 of 1 Author: Ole Berkeley, RN Service: Endoscopy Author Type: Registered Nurse  Filed: 11/09/2023  8:06 AM Date of Service: 11/09/2023  8:05 AM Status: Signed  Editor: Ole Berkeley, RN (Registered Nurse)  Unable to do procedure; pt didn't complete rx second pre bottle r/t over slept. Last BM 2 days ago. D/c stable

## 2023-11-09 NOTE — OR Nursing (Signed)
 Unable to do procedure; pt didn't complete rx second pre bottle r/t over slept. Last BM 2 days ago. D/c stable

## 2023-11-09 NOTE — Telephone Encounter (Signed)
 Called patient and she asked me to call her back in a couple hours. Noted to call patient later.

## 2023-11-13 NOTE — Telephone Encounter (Addendum)
Tried to call patient but could not leave message.

## 2023-11-14 NOTE — Telephone Encounter (Signed)
Tried to call patient but could not leave message.

## 2023-11-15 ENCOUNTER — Ambulatory Visit: Admitting: Psychiatry

## 2023-11-15 NOTE — Telephone Encounter (Signed)
 Letter is being sent to patient regarding scheduling the repeat.

## 2023-11-22 ENCOUNTER — Ambulatory Visit (INDEPENDENT_AMBULATORY_CARE_PROVIDER_SITE_OTHER): Admitting: Psychiatry

## 2023-11-22 DIAGNOSIS — F4321 Adjustment disorder with depressed mood: Secondary | ICD-10-CM

## 2023-11-22 DIAGNOSIS — F3130 Bipolar disorder, current episode depressed, mild or moderate severity, unspecified: Secondary | ICD-10-CM

## 2023-11-22 DIAGNOSIS — F3181 Bipolar II disorder: Secondary | ICD-10-CM

## 2023-11-22 MED ORDER — CARIPRAZINE HCL 1.5 MG PO CAPS: 1.5000 mg | ORAL_CAPSULE | Freq: Every day | ORAL | 2 refills | Status: DC

## 2023-11-22 NOTE — Progress Notes (Signed)
 BH MD/PA/NP OP Progress Note  11/22/2023 2:25 PM Cindy Grant  MRN:  [id number]  Chief Complaint:Follow-up appointment  HPI: 58 year old female being seen a [facility] for follow-up appointment.  Pt reports that she is doing better with the medication management, but is still feeling depressed throughout the day with intermittent uncontrollable crying episodes. She has shared that the time at her home is often full of overstimulation from her kids but states she has a "slight" improvement in her management of irritability and agitation.  Pt brought up a concern today stating that she feel she is going into her low episode. She states that this occurs, when she goes on a spending spree and report that this pattern is consistent in her bipolar disorder.  Pt states besides the minor symptoms, she would like to focus on her depression, which seems to not be resolved with the Lamictal  at 200mg .   Based on this assessment and interview it is recommended for patient to start Vraylar 1.5 mg once a day, and stop Abilify  to minimize the amount of antipsychotics. Pt is to continue cymbalta 30mg  once daily.  Pt is in agreement with treatment plan.  Pt denies SI/HI/AVH. No questions or concerns at this time. Pt will follow up in 2 weeks.  Visit Diagnosis:    ICD-10-CM   1. Bipolar 2 disorder (HCC)  F31.81     2. 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  Denies having a firearm in the home. Psychotherapy: - Patient recommended for psychotherapy for complicated grieving and has been placed on Christies Tina's wait list here at [facility] Legal: - denies  Past Medical  History:  Past Medical History:  Diagnosis Date   Anxiety    Arthritis    Asthma    COPD (chronic obstructive pulmonary disease) (HCC)    Depression    Hypertension    Hypothyroidism    Nicotine dependence    Smoked 30 years +.  Agrees to lung cancer screening.   Recurrent falls    Thyroid disease    Vertebrobasilar artery syndrome     Past Surgical History:  Procedure Laterality Date   ABDOMINAL HYSTERECTOMY     COLONOSCOPY N/A 11/09/2023   Procedure: COLONOSCOPY;  Surgeon: Marnee Sink, MD;  Location: [facility] ENDOSCOPY;  Service: Endoscopy;  Laterality: N/A;   COMPLEX REPAIR EARS     KNEE SURGERY Left    SHOULDER ARTHROSCOPY W/ ACROMIAL REPAIR     TONSILLECTOMY      Family Psychiatric History: Reports that brother has bipolar disorder as well as a son that also has bipolar disorder.   Family History: No family history on file.  Social History:  Social History   Socioeconomic History   Marital status: Widowed    Spouse name: Not on file   Number of children: 2   Years of education: Not on file   Highest education level: Some college, no degree  Occupational History   Not on file  Tobacco Use   Smoking status: Every Day    Current packs/day: 0.25    Types: Cigarettes, E-cigarettes    Passive exposure:  Current   Smokeless tobacco: Never   Tobacco comments:    Vape - daily 02/08/2023  Vaping Use   Vaping status: Every Day   Substances: THC, Flavoring  Substance and Sexual Activity   Alcohol use: Not Currently    Comment: occasionally   Drug use: Not Currently    Comment: thc   Sexual activity: Not Currently  Other Topics Concern   Not on file  Social History Narrative   Not on file   Social Drivers of Health   Financial Resource Strain: Not on file  Food Insecurity: Not on file  Transportation Needs: Not on file  Physical Activity: Not on file  Stress: Not on file  Social Connections: Not on file    Allergies:  Allergies  Allergen Reactions    Hydrocodone Itching   Oxycodone Itching   Penicillins Hives    Metabolic Disorder Labs: No results found for: "HGBA1C", "MPG" No results found for: "PROLACTIN" No results found for: "CHOL", "TRIG", "HDL", "CHOLHDL", "VLDL", "LDLCALC" No results found for: "TSH"  Therapeutic Level Labs: No results found for: "LITHIUM" No results found for: "VALPROATE" No results found for: "CBMZ"  Current Medications: Current Outpatient Medications  Medication Sig Dispense Refill   ARIPiprazole  (ABILIFY ) 2 MG tablet Take 1 mg by mouth daily.     Blood Pressure Monitoring (BLOOD PRESSURE KIT) DEVI 1 each by Does not apply route daily. 1 each 0   cetirizine (ZYRTEC) 10 MG tablet Take 10 mg by mouth daily.     DULoxetine (CYMBALTA) 30 MG capsule Take 30 mg by mouth daily.     fluticasone -salmeterol (ADVAIR) 100-50 MCG/ACT AEPB Inhale 1 puff into the lungs 2 (two) times daily. 1 each 3   lamoTRIgine  (LAMICTAL ) 100 MG tablet Take 2 tablets (200 mg total) by mouth daily. 60 tablet 0   levothyroxine  (SYNTHROID ) 150 MCG tablet Take 1 tablet (150 mcg total) by mouth daily. 90 tablet 3   losartan  (COZAAR ) 100 MG tablet Take 1 tablet (100 mg total) by mouth daily. 90 tablet 1   montelukast (SINGULAIR) 10 MG tablet Take 10 mg by mouth at bedtime.     No current facility-administered medications for this visit.     Musculoskeletal: Strength & Muscle Tone: within normal limits Gait & Station: normal Patient leans: N/A  Psychiatric Specialty Exam: Review of Systems  Constitutional: Negative.   HENT: Negative.    Eyes: Negative.   Respiratory: Negative.    Cardiovascular: Negative.   Gastrointestinal: Negative.   Endocrine: Negative.   Genitourinary: Negative.   Musculoskeletal: Negative.   Allergic/Immunologic: Negative.   Neurological: Negative.   Hematological: Negative.   Psychiatric/Behavioral:  Positive for dysphoric mood. The patient is nervous/anxious.     There were no vitals taken for  this visit.There is no height or weight on file to calculate BMI.  General Appearance: Well Groomed  Eye Contact:  Good  Speech:  Clear and Coherent  Volume:  Normal  Mood:  Anxious and Depressed  Affect:  Congruent  Thought Process:  Coherent  Orientation:  Full (Time, Place, and Person)  Thought Content: WDL   Suicidal Thoughts:  No  Homicidal Thoughts:  No  Memory:  Immediate;   Good Recent;   Good Remote;   Good  Judgement:  Good  Insight:  Good  Psychomotor Activity:  Normal  Concentration:  Concentration: Good and Attention Span: Good  Recall:  Good  Fund of Knowledge: Good  Language: Good  Akathisia:  No  Handed:  Right  AIMS (if indicated):   Assets:  Desire for Improvement Financial Resources/Insurance Housing  ADL's:  Intact  Cognition: WNL  Sleep:  Good   Screenings: GAD-7    Flowsheet Row Office Visit from 08/30/2023 in [facility] Visit from 06/26/2023 in [facility]  Total GAD-7 Score 17 17      PHQ2-9    Flowsheet Row [facility] Visit [facility] 10/19/2023 in [facility] Office Visit from 08/30/2023 in [facility] Visit from 06/26/2023 in [facility] at [facility] Total Score 6 4 6   PHQ-9 Total Score 21 16 18       Flowsheet Row Office Visit from 10/19/2023 in [facility]  C-SSRS RISK CATEGORY No Risk        Assessment and Plan:  Assessment - Diagnosis: Bipolar 2 disorder (HCC) [F31.81]  Complicated grieving [F43.21]  Differential Diagnosis: PTSD  - Progress: Baseline appointment - Risk Factors: Worsening symptoms  Plan - Medications:  Continue taking Lamictal  200mg  once a day, prescribed by PCP, patient has been educated on rashes to monitor for new rashes and to notify the clinic should she develop a rash and to stop medication.  Continue Duloxetine 30mg  once  daily, prescribed by PCP Stop Abilify , due to persistent depression. Start Vraylar  1.5mg , due to depression to attach Lamictal . Pt has been educated on the tardive dyskinesia and anbnormal movements to stop the medication and to call the clinic.  - Psychotherapy: Recommended for Psychotherapy with Kristina at the [place] office, placed on waitlist, approx availability in June - Education: Pt has been educated on medication purpose, side effect, and adverse reactions.   Pt has been educated on grieving and the recommendation for faith based community involvement and the benefits of therapy.  Pt has been educated on importance of medication compliance with bipolar type 2.   - Follow-Up: Pt will follow up in 2 weeks. - Referrals: Pt referred to therapy at [facility] with Merritt Ables.  - Safety Planning:  The patient has been educated, if they should have suicidal thoughts with or without a plan to call 911, or go to the closest emergency department.  Pt verbalized understanding.  Pt denies firearms within the home.  Pt also agrees to call the clinic should they have worsening symptoms before the next appointment.     Patient/Guardian was advised Release of Information must be obtained prior to any record release in order to collaborate their care with an outside provider. Patient/Guardian was advised if they have not already done so to contact the registration department to sign all necessary forms in order for us  to release information regarding their care.   Consent: Patient/Guardian gives verbal consent for treatment and assignment of benefits for services provided during this visit. Patient/Guardian expressed understanding and agreed to proceed.    Arlana Labor, NP 11/22/2023, 2:25 PM

## 2023-11-23 ENCOUNTER — Other Ambulatory Visit: Payer: Self-pay

## 2023-11-23 DIAGNOSIS — R195 Other fecal abnormalities: Secondary | ICD-10-CM

## 2023-11-23 MED ORDER — NA SULFATE-K SULFATE-MG SULF 17.5-3.13-1.6 GM/177ML PO SOLN: 1.0000 | Freq: Once | ORAL | 0 refills | Status: AC

## 2023-11-29 ENCOUNTER — Ambulatory Visit (INDEPENDENT_AMBULATORY_CARE_PROVIDER_SITE_OTHER): Admitting: Podiatry

## 2023-11-29 ENCOUNTER — Encounter: Payer: Self-pay | Admitting: Podiatry

## 2023-11-29 ENCOUNTER — Ambulatory Visit (INDEPENDENT_AMBULATORY_CARE_PROVIDER_SITE_OTHER)

## 2023-11-29 DIAGNOSIS — M2012 Hallux valgus (acquired), left foot: Secondary | ICD-10-CM

## 2023-11-30 ENCOUNTER — Encounter: Payer: Self-pay | Admitting: Podiatry

## 2023-11-30 NOTE — Progress Notes (Signed)
  Subjective:  Patient ID: Cindy Grant, female    DOB: Mar 30, 1966,  MRN: [id number]  Chief Complaint  Patient presents with   Routine Post Op    "It's fine.  The second toe was burning when you touch it and on top of my foot."     58 y.o. female returns for post-op check.   Review of Systems: Negative except as noted in the HPI. Denies N/V/F/Ch.   Objective:  There were no vitals filed for this visit. There is no height or weight on file to calculate BMI. Constitutional Well developed. Well nourished.  Vascular Foot warm and well perfused. Capillary refill normal to all digits.  Calf is soft and supple, no posterior calf or knee pain, negative Homans' sign  Neurologic Normal speech. Oriented to person, place, and time. Epicritic sensation to light touch grossly present bilaterally.  Dermatologic Incision is well-healed and not hypertrophic  Orthopedic: Little to no pain to palpation noted about the surgical site.  Mild edema   Multiple view plain film radiographs: Good full consolidation across fusion site Assessment:   1. Acquired hallux valgus of left foot    Plan:  Patient was evaluated and treated and all questions answered.  S/p foot surgery left - Regular shoe gear and activity as tolerated for both feet.  I have no restrictions.  She will follow-up me as needed for further issues.   Return if symptoms worsen or fail to improve.

## 2023-12-04 ENCOUNTER — Telehealth: Payer: Self-pay

## 2023-12-04 NOTE — Telephone Encounter (Signed)
 went online to [email or web address] and submitted the prior auth . - pending

## 2023-12-04 NOTE — Telephone Encounter (Signed)
 pt states that it is a issues on the vraylar .

## 2023-12-04 NOTE — Telephone Encounter (Signed)
 received notice from [email or web address] that the prior auth was denied. appeal has been submit and is pending.

## 2023-12-04 NOTE — Telephone Encounter (Signed)
 vraylar  needs a prior [place]

## 2023-12-06 ENCOUNTER — Ambulatory Visit: Admitting: Psychiatry

## 2023-12-07 NOTE — Telephone Encounter (Signed)
 vraylar  appeal was denied.

## 2023-12-13 ENCOUNTER — Ambulatory Visit (INDEPENDENT_AMBULATORY_CARE_PROVIDER_SITE_OTHER): Admitting: Psychiatry

## 2023-12-13 ENCOUNTER — Encounter: Payer: Self-pay | Admitting: Psychiatry

## 2023-12-13 VITALS — BP 136/80 | HR 71 | Temp 97.5°F | Ht 65.0 in | Wt 191.8 lb

## 2023-12-13 DIAGNOSIS — F4321 Adjustment disorder with depressed mood: Secondary | ICD-10-CM

## 2023-12-13 DIAGNOSIS — F314 Bipolar disorder, current episode depressed, severe, without psychotic features: Secondary | ICD-10-CM | POA: Diagnosis not present

## 2023-12-13 MED ORDER — CARIPRAZINE HCL 1.5 MG PO CAPS: 1.5000 mg | ORAL_CAPSULE | Freq: Every day | ORAL | 2 refills | Status: DC

## 2023-12-13 NOTE — Progress Notes (Unsigned)
 BH MD/PA/NP OP Progress Note  12/13/2023 1:10 PM GALEN MALKOWSKI  MRN:  [id number]  Chief Complaint:  Chief Complaint  Patient presents with  . Follow-up   HPI: *** Visit Diagnosis: No diagnosis found.  Past Psychiatric History: ***  Past Medical History:  Past Medical History:  Diagnosis Date  . Anxiety   . Arthritis   . Asthma   . COPD (chronic obstructive pulmonary disease) (HCC)   . Depression   . Hypertension   . Hypothyroidism   . Nicotine dependence    Smoked 30 years +.  Agrees to lung cancer screening.  . Recurrent falls   . Thyroid disease   . Vertebrobasilar artery syndrome     Past Surgical History:  Procedure Laterality Date  . ABDOMINAL HYSTERECTOMY    . COLONOSCOPY N/A 11/09/2023   Procedure: COLONOSCOPY;  Surgeon: Marnee Sink, MD;  Location: [facility] ENDOSCOPY;  Service: Endoscopy;  Laterality: N/A;  . COMPLEX REPAIR EARS    . KNEE SURGERY Left   . SHOULDER ARTHROSCOPY W/ ACROMIAL REPAIR    . TONSILLECTOMY      Family Psychiatric History: No additional.  Family History: History reviewed. No pertinent family history.  Social History:  Social History   Socioeconomic History  . Marital status: Widowed    Spouse name: Not on file  . Number of children: 2  . Years of education: Not on file  . Highest education level: Some college, no degree  Occupational History  . Not on file  Tobacco Use  . Smoking status: Every Day    Current packs/day: 0.25    Types: Cigarettes, E-cigarettes    Passive exposure: Current  . Smokeless tobacco: Never  . Tobacco comments:    Vape - daily 02/08/2023  Vaping Use  . Vaping status: Every Day  . Substances: THC, Flavoring  Substance and Sexual Activity  . Alcohol use: Not Currently    Comment: occasionally  . Drug use: Not Currently    Comment: thc  . Sexual activity: Not Currently  Other Topics Concern  . Not on file  Social History Narrative  . Not on file   Social Drivers of Health   Financial  Resource Strain: Not on file  Food Insecurity: Not on file  Transportation Needs: Not on file  Physical Activity: Not on file  Stress: Not on file  Social Connections: Not on file    Allergies:  Allergies  Allergen Reactions  . Hydrocodone Itching  . Oxycodone Itching  . Penicillins Hives    Metabolic Disorder Labs: No results found for: HGBA1C, MPG No results found for: PROLACTIN No results found for: CHOL, TRIG, HDL, CHOLHDL, VLDL, LDLCALC No results found for: TSH  Therapeutic Level Labs: No results found for: LITHIUM No results found for: VALPROATE No results found for: CBMZ  Current Medications: Current Outpatient Medications  Medication Sig Dispense Refill  . ARIPiprazole  (ABILIFY ) 2 MG tablet Take 1 mg by mouth daily.    . Blood Pressure Monitoring (BLOOD PRESSURE KIT) DEVI 1 each by Does not apply route daily. 1 each 0  . cariprazine  (VRAYLAR ) 1.5 MG capsule Take 1 capsule (1.5 mg total) by mouth daily. 30 capsule 2  . cetirizine (ZYRTEC) 10 MG tablet Take 10 mg by mouth daily.    . DULoxetine (CYMBALTA) 30 MG capsule Take 30 mg by mouth daily.    . fluticasone -salmeterol (ADVAIR) 100-50 MCG/ACT AEPB Inhale 1 puff into the lungs 2 (two) times daily. 1 each 3  . lamoTRIgine  (  LAMICTAL ) 100 MG tablet Take 2 tablets (200 mg total) by mouth daily. 60 tablet 0  . levothyroxine  (SYNTHROID ) 150 MCG tablet Take 1 tablet (150 mcg total) by mouth daily. 90 tablet 3  . losartan  (COZAAR ) 100 MG tablet Take 1 tablet (100 mg total) by mouth daily. 90 tablet 1  . montelukast (SINGULAIR) 10 MG tablet Take 10 mg by mouth at bedtime.     No current facility-administered medications for this visit.     Musculoskeletal: Strength & Muscle Tone: within normal limits Gait & Station: normal Patient leans: N/A  Psychiatric Specialty Exam: Review of Systems  Constitutional: Negative.   HENT: Negative.    Eyes: Negative.   Respiratory: Negative.     Cardiovascular: Negative.   Gastrointestinal: Negative.   Endocrine: Negative.   Genitourinary: Negative.   Musculoskeletal: Negative.   Skin: Negative.   Allergic/Immunologic: Negative.   Neurological: Negative.   Hematological: Negative.   Psychiatric/Behavioral:  Positive for agitation and dysphoric mood.     Blood pressure 136/80, pulse 71, temperature (!) 97.5 F (36.4 C), temperature source Temporal, height 5' 5 (1.651 m), weight 191 lb 12.8 oz (87 kg).Body mass index is 31.92 kg/m.  General Appearance: Well Groomed  Eye Contact:  Good  Speech:  Clear and Coherent  Volume:  Normal  Mood:  Anxious and Depressed  Affect:  Depressed  Thought Process:  Coherent  Orientation:  Full (Time, Place, and Person)  Thought Content: Logical   Suicidal Thoughts:  No  Homicidal Thoughts:  No  Memory:  Immediate;   Good Recent;   Good Remote;   Good  Judgement:  Good  Insight:  Good  Psychomotor Activity:  Normal  Concentration:  Concentration: Good and Attention Span: Good  Recall:  Good  Fund of Knowledge: Good  Language: Good  Akathisia:  No  Handed:  Right  AIMS (if indicated):   Assets:  Desire for Improvement Financial Resources/Insurance Housing  ADL's:  Intact  Cognition: WNL  Sleep:  Good   Screenings: GAD-7    Flowsheet Row Office Visit from 11/22/2023 in [facility] Office Visit from 08/30/2023 in [facility] [place] Office Visit from 06/26/2023 in [facility]  Total GAD-7 Score 18 17 17    PHQ2-9    Flowsheet Row Office Visit from 11/22/2023 in [facility] Office Visit from 10/19/2023 in [facility] Office Visit from 08/30/2023 in [facility] [place] Office Visit from 06/26/2023 in [facility] Total Score 6 6 4 6   PHQ-9 Total Score 20 21 16 18     Flowsheet Row Office Visit from 11/22/2023 in [facility] Office Visit from 10/19/2023 in [facility]  C-SSRS RISK CATEGORY No Risk No Risk     Assessment and Plan:  Assessment - Diagnosis: Bipolar 2 disorder (HCC) [F31.81]  Complicated grieving [F43.21]  Differential Diagnosis: PTSD  - Progress: Baseline appointment - Risk Factors: Worsening symptoms  Plan - Medications:  Continue taking Lamictal  200mg  once a day, prescribed by PCP, patient has been educated on rashes to monitor for new rashes and to notify the clinic should she develop a rash and to stop medication.  Stop Duloxetine, pt advised to monitor side effects and call provider if this occurs.  Continue Vraylar  1.5mg , due to depression to attach Lamictal . Pt has been educated on  the tardive dyskinesia and anbnormal movements to stop the medication and to call the clinic.  - Psychotherapy: Pt referred to [facility].  - Education: Pt has been educated on medication purpose, side effect, and adverse reactions.   Pt has been educated on grieving and the recommendation for faith based community involvement and the benefits of therapy.  Pt has been educated on importance of medication compliance with bipolar type 2.   - Follow-Up: Pt will follow up in 2 weeks. - Referrals: Pt referred to therapy at [facility] with Merritt Ables.  - Safety Planning:  The patient has been educated, if they should have suicidal thoughts with or without a plan to call 911, or go to the closest emergency department.  Pt verbalized understanding.  Pt denies firearms within the home.  Pt also agrees to call the clinic should they have worsening symptoms before the next appointment.     Patient/Guardian was advised Release of Information must be obtained prior to any record release in order to collaborate their care with an outside provider.  Patient/Guardian was advised if they have not already done so to contact the registration department to sign all necessary forms in order for us  to release information regarding their care.   Consent: Patient/Guardian gives verbal consent for treatment and assignment of benefits for services provided during this visit. Patient/Guardian expressed understanding and agreed to proceed.    Arlana Labor, NP 12/13/2023, 1:10 PM

## 2023-12-20 ENCOUNTER — Telehealth: Payer: Self-pay

## 2023-12-20 ENCOUNTER — Other Ambulatory Visit: Payer: Self-pay | Admitting: Psychiatry

## 2023-12-20 MED ORDER — ARIPIPRAZOLE 5 MG PO TABS: 5.0000 mg | ORAL_TABLET | Freq: Every day | ORAL | 0 refills | Status: DC

## 2023-12-20 NOTE — Telephone Encounter (Signed)
 pharmacy states that pt wants something more cost effective in place of the vrylar

## 2023-12-20 NOTE — Telephone Encounter (Signed)
 pt was notified. she stated that she been on abilify  2mg  before but that she will try the 5mg  and see if it helps.

## 2023-12-24 ENCOUNTER — Other Ambulatory Visit: Payer: Self-pay | Admitting: Family Medicine

## 2023-12-24 DIAGNOSIS — J4489 Other specified chronic obstructive pulmonary disease: Secondary | ICD-10-CM

## 2023-12-26 NOTE — Telephone Encounter (Signed)
 Copied from CRM [id number]. Topic: Clinical - Medication Question >> Dec 26, 2023  3:03 PM Sophia H wrote: Reason for CRM: Patient is requesting a list of all of the medication she should be taking, she is having a hard time following the medication regimen that she was given and would like this for her records. States it can be sent through my chart. Please advise # [phone]

## 2023-12-27 ENCOUNTER — Encounter: Payer: Self-pay | Admitting: Gastroenterology

## 2023-12-27 ENCOUNTER — Ambulatory Visit: Admitting: Psychiatry

## 2023-12-28 ENCOUNTER — Ambulatory Visit: Admitting: Anesthesiology

## 2023-12-28 ENCOUNTER — Ambulatory Visit
Admission: RE | Admit: 2023-12-28 | Discharge: 2023-12-28 | Disposition: A | Discharge status: Home / Self Care | Source: Ambulatory Visit | Attending: Gastroenterology | Admitting: Gastroenterology

## 2023-12-28 ENCOUNTER — Encounter
Admission: RE | Disposition: A | Discharge status: Home / Self Care | Payer: Self-pay | Source: Ambulatory Visit | Attending: Gastroenterology

## 2023-12-28 ENCOUNTER — Encounter: Payer: Self-pay | Admitting: Gastroenterology

## 2023-12-28 DIAGNOSIS — F17210 Nicotine dependence, cigarettes, uncomplicated: Secondary | ICD-10-CM | POA: Insufficient documentation

## 2023-12-28 DIAGNOSIS — Z1211 Encounter for screening for malignant neoplasm of colon: Secondary | ICD-10-CM | POA: Diagnosis not present

## 2023-12-28 DIAGNOSIS — Z79899 Other long term (current) drug therapy: Secondary | ICD-10-CM | POA: Insufficient documentation

## 2023-12-28 DIAGNOSIS — E039 Hypothyroidism, unspecified: Secondary | ICD-10-CM | POA: Insufficient documentation

## 2023-12-28 DIAGNOSIS — R195 Other fecal abnormalities: Secondary | ICD-10-CM | POA: Diagnosis present

## 2023-12-28 DIAGNOSIS — I10 Essential (primary) hypertension: Secondary | ICD-10-CM | POA: Diagnosis not present

## 2023-12-28 DIAGNOSIS — K635 Polyp of colon: Secondary | ICD-10-CM | POA: Diagnosis not present

## 2023-12-28 DIAGNOSIS — F17290 Nicotine dependence, other tobacco product, uncomplicated: Secondary | ICD-10-CM | POA: Diagnosis not present

## 2023-12-28 DIAGNOSIS — J4489 Other specified chronic obstructive pulmonary disease: Secondary | ICD-10-CM | POA: Insufficient documentation

## 2023-12-28 DIAGNOSIS — F418 Other specified anxiety disorders: Secondary | ICD-10-CM | POA: Diagnosis not present

## 2023-12-28 DIAGNOSIS — K621 Rectal polyp: Secondary | ICD-10-CM | POA: Insufficient documentation

## 2023-12-28 DIAGNOSIS — Z8673 Personal history of transient ischemic attack (TIA), and cerebral infarction without residual deficits: Secondary | ICD-10-CM | POA: Diagnosis not present

## 2023-12-28 HISTORY — DX: Cerebral infarction, unspecified: I63.9

## 2023-12-28 HISTORY — PX: POLYPECTOMY: SHX149

## 2023-12-28 HISTORY — PX: COLONOSCOPY: SHX5424

## 2023-12-28 SURGERY — COLONOSCOPY
Anesthesia: General

## 2023-12-28 MED ORDER — LIDOCAINE HCL (CARDIAC) PF 100 MG/5ML IV SOSY
PREFILLED_SYRINGE | INTRAVENOUS | Status: DC | PRN
  Administered 2023-12-28: 50 mg via INTRAVENOUS

## 2023-12-28 MED ORDER — SODIUM CHLORIDE 0.9 % IV SOLN: INTRAVENOUS | Status: DC

## 2023-12-28 MED ORDER — PROPOFOL 10 MG/ML IV BOLUS
INTRAVENOUS | Status: DC | PRN
  Administered 2023-12-28: 120 mg via INTRAVENOUS
  Administered 2023-12-28 (×3): 40 mg via INTRAVENOUS

## 2023-12-28 NOTE — Anesthesia Postprocedure Evaluation (Signed)
 Anesthesia Post Note  Patient: Cindy Grant  Procedure(s) Performed: COLONOSCOPY POLYPECTOMY, INTESTINE  Patient location during evaluation: PACU Anesthesia Type: General Level of consciousness: awake and alert Pain management: pain level controlled Vital Signs Assessment: post-procedure vital signs reviewed and stable Respiratory status: spontaneous breathing Cardiovascular status: stable Anesthetic complications: no   No notable events documented.   Last Vitals:  Vitals:   12/28/23 0936 12/28/23 0946  BP: (!) 146/81 124/77  Pulse: 62 62  Resp: (!) 27 16  Temp:    SpO2: 99% 100%    Last Pain:  Vitals:   12/28/23 0946  TempSrc:   PainSc: 0-No pain                 VAN STAVEREN,Espen Bethel

## 2023-12-28 NOTE — Anesthesia Preprocedure Evaluation (Signed)
 Anesthesia Evaluation  Patient identified by MRN, date of birth, ID band Patient awake    Reviewed: Allergy & Precautions, NPO status , Patient's Chart, lab work & pertinent test results  Airway Mallampati: II  TM Distance: >3 FB Neck ROM: full    Dental  (+) Edentulous Upper, Edentulous Lower   Pulmonary neg pulmonary ROS, COPD, Current Smoker and Patient abstained from smoking.   Pulmonary exam normal  + decreased breath sounds      Cardiovascular Exercise Tolerance: Good hypertension, Pt. on medications negative cardio ROS Normal cardiovascular exam Rhythm:Regular Rate:Normal     Neuro/Psych   Anxiety Depression    CVA negative neurological ROS  negative psych ROS   GI/Hepatic negative GI ROS, Neg liver ROS,,,  Endo/Other  negative endocrine ROSHypothyroidism    Renal/GU negative Renal ROS  negative genitourinary   Musculoskeletal   Abdominal   Peds negative pediatric ROS (+)  Hematology negative hematology ROS (+)   Anesthesia Other Findings Past Medical History: No date: Anxiety No date: Arthritis No date: Asthma No date: COPD (chronic obstructive pulmonary disease) (HCC) No date: Depression No date: Hypertension No date: Hypothyroidism No date: Nicotine dependence     Comment:  Smoked 30 years +.  Agrees to lung cancer screening. No date: Recurrent falls No date: Stroke [facility]) No date: Thyroid disease No date: Vertebrobasilar artery syndrome  Past Surgical History: No date: ABDOMINAL HYSTERECTOMY 11/09/2023: COLONOSCOPY; N/A     Comment:  Procedure: COLONOSCOPY;  Surgeon: Jinny Carmine, MD;                Location: [facility] ENDOSCOPY;  Service: Endoscopy;                Laterality: N/A; No date: COMPLEX REPAIR EARS No date: KNEE SURGERY; Left No date: SHOULDER ARTHROSCOPY W/ ACROMIAL REPAIR No date: TONSILLECTOMY     Reproductive/Obstetrics negative OB ROS                               Anesthesia Physical Anesthesia Plan  ASA: 3  Anesthesia Plan: General   Post-op Pain Management:    Induction: Intravenous  PONV Risk Score and Plan: Propofol infusion and TIVA  Airway Management Planned: Natural Airway and Nasal Cannula  Additional Equipment:   Intra-op Plan:   Post-operative Plan:   Informed Consent: I have reviewed the patients History and Physical, chart, labs and discussed the procedure including the risks, benefits and alternatives for the proposed anesthesia with the patient or authorized representative who has indicated his/her understanding and acceptance.     Dental Advisory Given  Plan Discussed with: CRNA  Anesthesia Plan Comments:         Anesthesia Quick Evaluation

## 2023-12-28 NOTE — Transfer of Care (Signed)
 Immediate Anesthesia Transfer of Care Note  Patient: Cindy Grant  Procedure(s) Performed: COLONOSCOPY POLYPECTOMY, INTESTINE  Patient Location: PACU  Anesthesia Type:General  Level of Consciousness: awake, drowsy, and patient cooperative  Airway & Oxygen Therapy: Patient Spontanous Breathing and Patient connected to nasal cannula oxygen  Post-op Assessment: Report given to RN  Post vital signs: Reviewed and stable  Last Vitals:  Vitals Value Taken Time  BP 105/45 12/28/23 09:26  Temp    Pulse 66 12/28/23 09:26  Resp 15 12/28/23 09:26  SpO2 99 % 12/28/23 09:26  Vitals shown include unfiled device data.  Last Pain:  Vitals:   12/28/23 0841  TempSrc: Temporal         Complications: No notable events documented.

## 2023-12-28 NOTE — Op Note (Signed)
 [facility] Gastroenterology Patient Name: Francile Woolford Procedure Date: 12/28/2023 8:51 AM MRN: [id number] Account #: [id number] Date of Birth: 25-Mar-1966 Admit Type: Outpatient Age: 58 Room: [facility] ENDO ROOM 4 Gender: Female Note Status: Finalized Instrument Name: [id number] [id number] Procedure:             Colonoscopy Indications:           Screening for colorectal malignant neoplasm due to                         positive Cologuard test Providers:             Rogelia Copping MD, MD Medicines:             Propofol per Anesthesia Complications:         No immediate complications. Procedure:             Pre-Anesthesia Assessment:                        - Prior to the procedure, a History and Physical was                         performed, and patient medications and allergies were                         reviewed. The patient's tolerance of previous                         anesthesia was also reviewed. The risks and benefits                         of the procedure and the sedation options and risks                         were discussed with the patient. All questions were                         answered, and informed consent was obtained. Prior                         Anticoagulants: The patient has taken no anticoagulant                         or antiplatelet agents. ASA Grade Assessment: II - A                         patient with mild systemic disease. After reviewing                         the risks and benefits, the patient was deemed in                         satisfactory condition to undergo the procedure.                        After obtaining informed consent, the colonoscope was                         passed under direct vision. Throughout  the procedure,                         the patient's blood pressure, pulse, and oxygen                         saturations were monitored continuously. The                         Colonoscope was introduced through the  anus and                         advanced to the the cecum, identified by appendiceal                         orifice and ileocecal valve. The colonoscopy was                         performed without difficulty. The patient tolerated                         the procedure well. The quality of the bowel                         preparation was adequate to identify polyps. Findings:      The perianal and digital rectal examinations were normal.      Two sessile polyps were found in the rectum. The polyps were 2 to 3 mm       in size. These polyps were removed with a cold snare. Resection and       retrieval were complete.      A 3 mm polyp was found in the sigmoid colon. The polyp was sessile. The       polyp was removed with a cold snare. Resection and retrieval were       complete. Impression:            - Two 2 to 3 mm polyps in the rectum, removed with a                         cold snare. Resected and retrieved.                        - One 3 mm polyp in the sigmoid colon, removed with a                         cold snare. Resected and retrieved. Recommendation:        - Discharge patient to home.                        - Resume previous diet.                        - Continue present medications.                        - Await pathology results.                        - If the pathology report reveals adenomatous tissue,  then repeat the colonoscopy for surveillance in 5                         years. Procedure Code(s):     --- Professional ---                        [phone], Colonoscopy, flexible; with removal of                         tumor(s), polyp(s), or other lesion(s) by snare                         technique Diagnosis Code(s):     --- Professional ---                        Z12.11, Encounter for screening for malignant neoplasm                         of colon                        D12.5, Benign neoplasm of sigmoid colon CPT copyright 2022 American  Medical Association. All rights reserved. The codes documented in this report are preliminary and upon coder review may  be revised to meet current compliance requirements. Rogelia Copping MD, MD 12/28/2023 9:21:56 AM This report has been signed electronically. Number of Addenda: 0 Note Initiated On: 12/28/2023 8:51 AM Scope Withdrawal Time: 0 hours 8 minutes 16 seconds  Total Procedure Duration: 0 hours 14 minutes 54 seconds  Estimated Blood Loss:  Estimated blood loss: none.      [facility]

## 2023-12-28 NOTE — H&P (Signed)
 Rogelia Copping, MD [facility] [address] [place], [place] [address] Phone: [phone] Fax : [phone]  Primary Care Physician:  Ziglar, Susan K, MD Primary Gastroenterologist:  Dr. Copping  Pre-Procedure History & Physical: HPI:  Cindy Grant is a 58 y.o. female is here for a screening colonoscopy.   Past Medical History:  Diagnosis Date   Anxiety    Arthritis    Asthma    COPD (chronic obstructive pulmonary disease) (HCC)    Depression    Hypertension    Hypothyroidism    Nicotine dependence    Smoked 30 years +.  Agrees to lung cancer screening.   Recurrent falls    Stroke [facility])    Thyroid disease    Vertebrobasilar artery syndrome     Past Surgical History:  Procedure Laterality Date   ABDOMINAL HYSTERECTOMY     COLONOSCOPY N/A 11/09/2023   Procedure: COLONOSCOPY;  Surgeon: Copping Rogelia, MD;  Location: [facility] ENDOSCOPY;  Service: Endoscopy;  Laterality: N/A;   COMPLEX REPAIR EARS     KNEE SURGERY Left    SHOULDER ARTHROSCOPY W/ ACROMIAL REPAIR     TONSILLECTOMY      Prior to Admission medications   Medication Sig Start Date End Date Taking? Authorizing Provider  ARIPiprazole  (ABILIFY ) 5 MG tablet Take 1 tablet (5 mg total) by mouth daily. 12/20/23  Yes Saucier, Dorn Ruth, NP  lamoTRIgine  (LAMICTAL ) 100 MG tablet Take 2 tablets (200 mg total) by mouth daily. 10/19/23  Yes Saucier, Dorn Ruth, NP  levothyroxine  (SYNTHROID ) 150 MCG tablet Take 1 tablet (150 mcg total) by mouth daily. 08/30/23  Yes Ziglar, Susan K, MD  losartan  (COZAAR ) 100 MG tablet Take 1 tablet (100 mg total) by mouth daily. 08/30/23  Yes Ziglar, Susan K, MD  NAPOLEON INHUB 100-50 MCG/ACT AEPB INHALE 1 PUFF INTO THE LUNGS TWICE A DAY 12/26/23  Yes Ziglar, Susan K, MD  Blood Pressure Monitoring (BLOOD PRESSURE KIT) DEVI 1 each by Does not apply route daily. 08/30/23   Ziglar, Susan K, MD  cetirizine (ZYRTEC) 10 MG tablet Take 10 mg by mouth daily. 08/10/23   [provider]  montelukast  (SINGULAIR) 10 MG tablet Take 10 mg by mouth at bedtime.    [provider]    Allergies as of 11/23/2023 - Review Complete 11/09/2023  Allergen Reaction Noted   Hydrocodone Itching 09/30/2015   Oxycodone Itching 09/30/2015   Penicillins Hives 09/30/2015    History reviewed. No pertinent family history.  Social History   Socioeconomic History   Marital status: Widowed    Spouse name: Not on file   Number of children: 2   Years of education: Not on file   Highest education level: Some college, no degree  Occupational History   Not on file  Tobacco Use   Smoking status: Every Day    Current packs/day: 0.25    Types: Cigarettes, E-cigarettes    Passive exposure: Current   Smokeless tobacco: Never   Tobacco comments:    Vape - daily 02/08/2023  Vaping Use   Vaping status: Every Day   Substances: THC, Flavoring  Substance and Sexual Activity   Alcohol use: Not Currently    Comment: occasionally   Drug use: Yes    Types: Marijuana    Comment: thc   Sexual activity: Not Currently  Other Topics Concern   Not on file  Social History Narrative   Not on file   Social Drivers of Corporate investment banker  Strain: Not on file  Food Insecurity: Not on file  Transportation Needs: Not on file  Physical Activity: Not on file  Stress: Not on file  Social Connections: Not on file  Intimate Partner Violence: Not on file    Review of Systems: See HPI, otherwise negative ROS  Physical Exam: BP (!) 150/81   Pulse 96   Temp (!) 96.6 F (35.9 C) (Temporal)   Resp 16   Wt 85.5 kg   SpO2 96%   BMI 31.38 kg/m  General:   Alert,  pleasant and cooperative in NAD Head:  Normocephalic and atraumatic. Neck:  Supple; no masses or thyromegaly. Lungs:  Clear throughout to auscultation.    Heart:  Regular rate and rhythm. Abdomen:  Soft, nontender and nondistended. Normal bowel sounds, without guarding, and without rebound.   Neurologic:  Alert and  oriented x4;   grossly normal neurologically.  Impression/Plan: Cindy Grant is now here to undergo a screening colonoscopy.  Risks, benefits, and alternatives regarding colonoscopy have been reviewed with the patient.  Questions have been answered.  All parties agreeable.

## 2024-01-01 LAB — SURGICAL PATHOLOGY

## 2024-01-02 ENCOUNTER — Ambulatory Visit: Payer: Self-pay | Admitting: Gastroenterology

## 2024-01-24 ENCOUNTER — Encounter: Payer: Self-pay | Admitting: Psychiatry

## 2024-01-24 ENCOUNTER — Ambulatory Visit (INDEPENDENT_AMBULATORY_CARE_PROVIDER_SITE_OTHER): Admitting: Psychiatry

## 2024-01-24 VITALS — BP 142/88 | HR 66 | Temp 98.8°F | Ht 65.0 in | Wt 193.0 lb

## 2024-01-24 DIAGNOSIS — F99 Mental disorder, not otherwise specified: Secondary | ICD-10-CM | POA: Diagnosis not present

## 2024-01-24 DIAGNOSIS — F314 Bipolar disorder, current episode depressed, severe, without psychotic features: Secondary | ICD-10-CM | POA: Diagnosis not present

## 2024-01-24 DIAGNOSIS — F4321 Adjustment disorder with depressed mood: Secondary | ICD-10-CM

## 2024-01-24 DIAGNOSIS — F5105 Insomnia due to other mental disorder: Secondary | ICD-10-CM | POA: Diagnosis not present

## 2024-01-24 MED ORDER — HYDROXYZINE HCL 25 MG PO TABS: 25.0000 mg | ORAL_TABLET | Freq: Every evening | ORAL | 0 refills | Status: DC | PRN

## 2024-01-24 NOTE — Progress Notes (Signed)
 [facility] MD/PA/NP OP Progress Note  01/24/2024 8:11 AM Cindy Grant  MRN:  [id number]  Chief Complaint:  Chief Complaint  Patient presents with   Follow-up   HPI: 58 year old female presenting to [facility] for follow-up.  Patient reports that she is feeling much better and he appears to be in a better mood and stating that she has not been having as many tearful episodes and states that she feels much better with the news from her colonoscopy appointment.  Patient reports that she was reluctant to go to a colonoscopy appointment until she remembered the conversation with this provider in which it is a recommended procedure due to the risk of colon cancer and stated that due to the test giving a positive this is just verification if it is true.  Patient shared that it was a negative test and she is really happy as the doctor even gave further support for her stating that her colon looked very good and does not need another test for 10 years.  Patient states that she felt so good that she has does not have to question now and be anxious about the positive screening test.  Patient reports she is doing well at home stating that she is better managing her emotions while taking care of the children stating that she is not having as many explosive episodes with her kids.  Patient reports that the medications she is taking she feels is going very well stating that she is feeling levelheaded and states that the irritability and explosive behavior has come down quite a bit and the crying episodes.  Patient reports that she no longer gets uncontrollable tearing and states that she is very satisfied with this.  Patient does report though that she is not sleeping well so this will be monitored carefully patient has been encouraged to keep hobbies that is not screen time as well as a book at the bedside and to try to stay in bed throughout the night rather than get up and try to be busy.  Patient reports only getting roughly 4  hours of sleep at night.  Patient was also encouraged to have chamomile tea before.  Based on this assessment interview is recommended for the patient to continue medications with the addition of hydroxyzine  25 mg once a day before bed as needed for sleep.  Patient will continue current medication regimen.  Patient with no other questions or concerns at this time.  Patient denies SI, HI, AVH.  Patient is in agreement with treatment plan.  Patient will follow up in 2 weeks.  Visit Diagnosis:    ICD-10-CM   1. Bipolar disorder, current episode depressed, severe, without psychotic features (HCC)  F31.4     2. Complicated grieving  F43.21     3. Insomnia due to other mental disorder  F51.05    F99       Past Psychiatric History:  Previous Psych Hospitalizations: Denies   Outpatient treatment: Psychotropic medications started by PCP   Medications Current: - Abilify  5mg  once day - Lamictal  200 mg once a day -Hydroxyzine  25 mg once a day before bed as needed for sleep   Medication Trials: - 2025, Duloxetine, poor response   Suicide & Violence: - Denies SI, HI, AVH.  Denies having a firearm in the home.   Psychotherapy: - Patient recommended for psychotherapy for complicated grieving and has been referred to [facility] at [employer].    Legal: - denies  Past Medical  History:  Past Medical History:  Diagnosis Date   Anxiety    Arthritis    Asthma    COPD (chronic obstructive pulmonary disease) (HCC)    Depression    Hypertension    Hypothyroidism    Nicotine dependence    Smoked 30 years +.  Agrees to lung cancer screening.   Recurrent falls    Stroke [facility])    Thyroid disease    Vertebrobasilar artery syndrome     Past Surgical History:  Procedure Laterality Date   ABDOMINAL HYSTERECTOMY     COLONOSCOPY N/A 11/09/2023   Procedure: COLONOSCOPY;  Surgeon: Jinny Carmine, MD;  Location: [facility] ENDOSCOPY;  Service: Endoscopy;  Laterality: N/A;   COLONOSCOPY  N/A 12/28/2023   Procedure: COLONOSCOPY;  Surgeon: Jinny Carmine, MD;  Location: [facility] ENDOSCOPY;  Service: Endoscopy;  Laterality: N/A;   COMPLEX REPAIR EARS     KNEE SURGERY Left    POLYPECTOMY  12/28/2023   Procedure: POLYPECTOMY, INTESTINE;  Surgeon: Jinny Carmine, MD;  Location: [facility] ENDOSCOPY;  Service: Endoscopy;;   SHOULDER ARTHROSCOPY W/ ACROMIAL REPAIR     TONSILLECTOMY      Family Psychiatric History: No additional  Family History: History reviewed. No pertinent family history.  Social History:  Social History   Socioeconomic History   Marital status: Widowed    Spouse name: Not on file   Number of children: 2   Years of education: Not on file   Highest education level: Some college, no degree  Occupational History   Not on file  Tobacco Use   Smoking status: Every Day    Current packs/day: 0.25    Types: Cigarettes, E-cigarettes    Passive exposure: Current   Smokeless tobacco: Never   Tobacco comments:    Vape - daily 02/08/2023  Vaping Use   Vaping status: Every Day   Substances: THC, Flavoring  Substance and Sexual Activity   Alcohol use: Not Currently    Comment: occasionally   Drug use: Yes    Types: Marijuana    Comment: thc   Sexual activity: Not Currently  Other Topics Concern   Not on file  Social History Narrative   Not on file   Social Drivers of Health   Financial Resource Strain: Not on file  Food Insecurity: Not on file  Transportation Needs: Not on file  Physical Activity: Not on file  Stress: Not on file  Social Connections: Not on file    Allergies:  Allergies  Allergen Reactions   Hydrocodone Itching   Oxycodone Itching   Penicillins Hives    Metabolic Disorder Labs: No results found for: HGBA1C, MPG No results found for: PROLACTIN No results found for: CHOL, TRIG, HDL, CHOLHDL, VLDL, LDLCALC No results found for: TSH  Therapeutic Level Labs: No results found for: LITHIUM No results found for:  VALPROATE No results found for: CBMZ  Current Medications: Current Outpatient Medications  Medication Sig Dispense Refill   ARIPiprazole  (ABILIFY ) 5 MG tablet Take 1 tablet (5 mg total) by mouth daily. 30 tablet 0   Blood Pressure Monitoring (BLOOD PRESSURE KIT) DEVI 1 each by Does not apply route daily. 1 each 0   lamoTRIgine  (LAMICTAL ) 100 MG tablet Take 2 tablets (200 mg total) by mouth daily. 60 tablet 0   levothyroxine  (SYNTHROID ) 150 MCG tablet Take 1 tablet (150 mcg total) by mouth daily. 90 tablet 3   losartan  (COZAAR ) 100 MG tablet Take 1 tablet (100 mg total) by mouth daily. 90 tablet 1  cetirizine (ZYRTEC) 10 MG tablet Take 10 mg by mouth daily.     montelukast (SINGULAIR) 10 MG tablet Take 10 mg by mouth at bedtime.     WIXELA INHUB 100-50 MCG/ACT AEPB INHALE 1 PUFF INTO THE LUNGS TWICE A DAY 60 each 3   No current facility-administered medications for this visit.     Musculoskeletal: Strength & Muscle Tone: within normal limits Gait & Station: normal Patient leans: N/A  Psychiatric Specialty Exam: Review of Systems  Constitutional: Negative.   HENT: Negative.    Eyes: Negative.   Respiratory: Negative.    Cardiovascular: Negative.   Gastrointestinal: Negative.   Endocrine: Negative.   Genitourinary: Negative.   Musculoskeletal:  Positive for arthralgias.  Skin: Negative.   Allergic/Immunologic: Negative.   Neurological: Negative.   Hematological: Negative.   Psychiatric/Behavioral:  The patient is nervous/anxious.     Blood pressure (!) 142/88, pulse 66, temperature 98.8 F (37.1 C), temperature source Temporal, height 5' 5 (1.651 m), weight 193 lb (87.5 kg), SpO2 96%.Body mass index is 32.12 kg/m.  General Appearance: Well Groomed  Eye Contact:  Good  Speech:  Clear and Coherent  Volume:  Normal  Mood:  Anxious  Affect:  Appropriate  Thought Process:  Coherent  Orientation:  Full (Time, Place, and Person)  Thought Content: Logical   Suicidal  Thoughts:  No  Homicidal Thoughts:  No  Memory:  Immediate;   Good Recent;   Good Remote;   Good  Judgement:  Good  Insight:  Good  Psychomotor Activity:  Normal  Concentration:  Concentration: Good and Attention Span: Good  Recall:  Good  Fund of Knowledge: Good  Language: Good  Akathisia:  No  Handed:  Right  AIMS (if indicated): not done  Assets:  Desire for Improvement Financial Resources/Insurance Housing  ADL's:  Intact  Cognition: WNL  Sleep:  Good   Screenings: GAD-7    Flowsheet Row Office Visit from 11/22/2023 in [facility] Office Visit from 08/30/2023 in [facility] [place] Office Visit from 06/26/2023 in [facility]  Total GAD-7 Score 18 17 17    PHQ2-9    Flowsheet Row Office Visit from 11/22/2023 in [facility] Office Visit from 10/19/2023 in [facility] Office Visit from 08/30/2023 in [facility] Visit from 06/26/2023 in [facility] Total Score 6 6 4 6   PHQ-9 Total Score 20 21 16 18    Flowsheet Row Admission (Discharged) from 12/28/2023 in [facility] Office Visit from 11/22/2023 in [facility] Office Visit from 10/19/2023 in [facility]  C-SSRS RISK CATEGORY Error: Question 6 not populated No Risk No Risk     Assessment and Plan:  Assessment - Diagnosis: Bipolar 2 disorder (HCC) [F31.81]  Complicated grieving [F43.21]  Insomnia due to other mental disorder [F51.05, F99]  Differential Diagnosis: PTSD  - Progress: Baseline appointment - Risk Factors: Worsening symptoms  Plan - Medications:  Continue taking Lamictal  200mg  once a day, prescribed by PCP, patient has been educated on rashes to monitor for new rashes and to  notify the clinic should she develop a rash and to stop medication.  Continue Abilify  5mg , patient has been educated on tardive dyskinesia and abnormal movements and to monitor for symptoms, if this occurs to stop the medication and contact provider.  Start 25mg  Hydroxyzine  once daily before bed as needed for sleep - Psychotherapy: Pt referred to [facility] at [employer].  - Education: Pt has been educated on medication purpose, side effect, and adverse reactions.   Pt has been educated on grieving and the recommendation for faith based community involvement and the benefits of therapy.  Pt has been educated on importance of medication compliance with bipolar type 2.   - Follow-Up: Pt will follow up in 2 weeks. - Referrals: Pt referred to therapy with Geryl [facility] at [facility] - Safety Planning:  The patient has been educated, if they should have suicidal thoughts with or without a plan to call 911, or go to the closest emergency department.  Pt verbalized understanding.  Pt denies firearms within the home.  Pt also agrees to call the clinic should they have worsening symptoms before the next appointment.   Patient/Guardian was advised Release of Information must be obtained prior to any record release in order to collaborate their care with an outside provider. Patient/Guardian was advised if they have not already done so to contact the registration department to sign all necessary forms in order for us  to release information regarding their care.   Consent: Patient/Guardian gives verbal consent for treatment and assignment of benefits for services provided during this visit. Patient/Guardian expressed understanding and agreed to proceed.    Dorn Jama Der, NP 01/24/2024, 8:11 AM

## 2024-01-29 ENCOUNTER — Telehealth: Payer: Self-pay

## 2024-01-29 ENCOUNTER — Other Ambulatory Visit: Payer: Self-pay | Admitting: Psychiatry

## 2024-01-29 DIAGNOSIS — F3181 Bipolar II disorder: Secondary | ICD-10-CM

## 2024-01-29 MED ORDER — LAMOTRIGINE 100 MG PO TABS: 200.0000 mg | ORAL_TABLET | Freq: Every day | ORAL | 2 refills | Status: DC

## 2024-01-29 NOTE — Telephone Encounter (Signed)
 Received fax from patients pharmacy requesting a refill of lamoTRIgine  (LAMICTAL ) 100 MG tablet   Last visit 01-24-24  Next visit 02-07-24    Preferred Pharmacies   [id number] [place], [place] - [address] Phone: [phone]  Fax: [phone]

## 2024-02-05 ENCOUNTER — Telehealth: Payer: Self-pay

## 2024-02-05 ENCOUNTER — Other Ambulatory Visit: Payer: Self-pay | Admitting: Psychiatry

## 2024-02-05 MED ORDER — ARIPIPRAZOLE 5 MG PO TABS: 5.0000 mg | ORAL_TABLET | Freq: Every day | ORAL | 2 refills | Status: DC

## 2024-02-05 NOTE — Telephone Encounter (Signed)
 pt was notified. pt was also advised to call office for refills when she has about 7 pills left so that way she never without her pills.

## 2024-02-05 NOTE — Telephone Encounter (Signed)
 pt left a message that she needs a refill on the aripiprazole . she states she been out for a week. pt was last seen on 7-30 .  pt also states that she needs to r/s appt that is currently set up 8-13 (front desk was notified to contact pt to r/s appt)

## 2024-02-07 ENCOUNTER — Ambulatory Visit: Admitting: Psychiatry

## 2024-02-14 ENCOUNTER — Ambulatory Visit (INDEPENDENT_AMBULATORY_CARE_PROVIDER_SITE_OTHER): Admitting: Psychiatry

## 2024-02-14 ENCOUNTER — Encounter: Payer: Self-pay | Admitting: Psychiatry

## 2024-02-14 VITALS — BP 148/86 | HR 74 | Temp 98.4°F | Ht 65.0 in | Wt 192.0 lb

## 2024-02-14 DIAGNOSIS — F5105 Insomnia due to other mental disorder: Secondary | ICD-10-CM | POA: Diagnosis not present

## 2024-02-14 DIAGNOSIS — F3175 Bipolar disorder, in partial remission, most recent episode depressed: Secondary | ICD-10-CM | POA: Diagnosis not present

## 2024-02-14 DIAGNOSIS — F99 Mental disorder, not otherwise specified: Secondary | ICD-10-CM

## 2024-02-14 NOTE — Progress Notes (Signed)
 BH MD/PA/NP OP Progress Note  02/14/2024 8:02 AM Cindy Grant  MRN:  [id number]  Chief Complaint:  Chief Complaint  Patient presents with   Follow-up   HPI: 58 year old female presenting to [facility] for follow-up.  Patient reports that she is feeling much better this week as she has been getting better sleep as well as feeling better about her mood with irritability and anger stating that she is doing much better with the children.  Patient does state that she wishes she could move out but states that she is stressed because she does not have the finances to be able to do so.  Patient is permanently disabled and states that she only gets a disability check once a month and feels that it is very difficult to try to find a place to move into to call her own.  Patient reports that the stress of having to live with her son is becoming considerable and states that she would very much like to move out but understands the limitations as she is allowing him and is unable to afford a commercial apartment.  Patient reports as well that the hydroxyzine  has been working well for her and states that her anxiety is getting much better and next day stating that she is happy with this medication.  Patient reports that currently her current concerns are her spending habits stating that she recently spent a lot of unnecessary money on a tattoo which was a considerable amount of her check as well as impulsive buying.  Patient reports that she has not been able to start with therapy and states she would rather just stick with this provider for psychotherapy and understands the limitations as this provider only provides 30-minute follow-ups.  Patient reports that she has a financial plan with her daughter to try to save money as well as to try to control impulsive buying.  Patient also has a plan to try to get more involved in community events as well as stating that church was a big part of her life and states that she is  looking forward to try to get back involved in charge.  Based on this assessment interview is recommended for the patient to continue medications as prescribed.  See plan.  Patient denies SI, HI, AVH.  Patient with no other questions or concerns.  Patient is in agreement with treatment plan.  Patient to follow up in 2 weeks in person. Visit Diagnosis:    ICD-10-CM   1. Insomnia due to other mental disorder  F51.05    F99     2. Bipolar disorder, in partial remission, most recent episode depressed (HCC)  F31.75       Past Psychiatric History:  Previous Psych Hospitalizations: Denies   Outpatient treatment: Psychotropic medications started by PCP   Medications Current: - Abilify  5mg  once day - Lamictal  200 mg once a day -Hydroxyzine  25 mg once a day before bed as needed for sleep   Medication Trials: - 2025, Duloxetine, poor response   Suicide & Violence: - Denies SI, HI, AVH.  Denies having a firearm in the home.   Psychotherapy: - Patient recommended for psychotherapy for complicated grieving and has been referred to [facility] [employer].    Legal: - denies    Past Medical History:  Past Medical History:  Diagnosis Date   Anxiety    Arthritis    Asthma    COPD (chronic obstructive pulmonary disease) (HCC)    Depression  Hypertension    Hypothyroidism    Nicotine dependence    Smoked 30 years +.  Agrees to lung cancer screening.   Recurrent falls    Stroke [facility])    Thyroid disease    Vertebrobasilar artery syndrome     Past Surgical History:  Procedure Laterality Date   ABDOMINAL HYSTERECTOMY     COLONOSCOPY N/A 11/09/2023   Procedure: COLONOSCOPY;  Surgeon: Jinny Carmine, MD;  Location: [facility] ENDOSCOPY;  Service: Endoscopy;  Laterality: N/A;   COLONOSCOPY N/A 12/28/2023   Procedure: COLONOSCOPY;  Surgeon: Jinny Carmine, MD;  Location: [facility] ENDOSCOPY;  Service: Endoscopy;  Laterality: N/A;   COMPLEX REPAIR EARS     KNEE SURGERY Left     POLYPECTOMY  12/28/2023   Procedure: POLYPECTOMY, INTESTINE;  Surgeon: Jinny Carmine, MD;  Location: [facility] ENDOSCOPY;  Service: Endoscopy;;   SHOULDER ARTHROSCOPY W/ ACROMIAL REPAIR     TONSILLECTOMY      Family Psychiatric History: No additional   Family History: History reviewed. No pertinent family history.  Social History:  Social History   Socioeconomic History   Marital status: Widowed    Spouse name: Not on file   Number of children: 2   Years of education: Not on file   Highest education level: Some college, no degree  Occupational History   Not on file  Tobacco Use   Smoking status: Every Day    Current packs/day: 0.25    Types: Cigarettes, E-cigarettes    Passive exposure: Current   Smokeless tobacco: Never   Tobacco comments:    Vape - daily 02/08/2023  Vaping Use   Vaping status: Every Day   Substances: THC, Flavoring  Substance and Sexual Activity   Alcohol use: Not Currently    Comment: occasionally   Drug use: Yes    Types: Marijuana    Comment: thc   Sexual activity: Not Currently  Other Topics Concern   Not on file  Social History Narrative   Not on file   Social Drivers of Health   Financial Resource Strain: Not on file  Food Insecurity: Not on file  Transportation Needs: Not on file  Physical Activity: Not on file  Stress: Not on file  Social Connections: Not on file    Allergies:  Allergies  Allergen Reactions   Hydrocodone Itching   Oxycodone Itching   Penicillins Hives    Metabolic Disorder Labs: No results found for: HGBA1C, MPG No results found for: PROLACTIN No results found for: CHOL, TRIG, HDL, CHOLHDL, VLDL, LDLCALC No results found for: TSH  Therapeutic Level Labs: No results found for: LITHIUM No results found for: VALPROATE No results found for: CBMZ  Current Medications: Current Outpatient Medications  Medication Sig Dispense Refill   ARIPiprazole  (ABILIFY ) 5 MG tablet Take 1 tablet (5 mg  total) by mouth daily. 30 tablet 2   Blood Pressure Monitoring (BLOOD PRESSURE KIT) DEVI 1 each by Does not apply route daily. 1 each 0   cetirizine (ZYRTEC) 10 MG tablet Take 10 mg by mouth daily.     hydrOXYzine  (ATARAX ) 25 MG tablet Take 1 tablet (25 mg total) by mouth at bedtime as needed. 90 tablet 0   lamoTRIgine  (LAMICTAL ) 100 MG tablet Take 2 tablets (200 mg total) by mouth daily. 60 tablet 2   levothyroxine  (SYNTHROID ) 150 MCG tablet Take 1 tablet (150 mcg total) by mouth daily. 90 tablet 3   losartan  (COZAAR ) 100 MG tablet Take 1 tablet (100 mg total) by mouth daily. 90  tablet 1   montelukast (SINGULAIR) 10 MG tablet Take 10 mg by mouth at bedtime.     WIXELA INHUB 100-50 MCG/ACT AEPB INHALE 1 PUFF INTO THE LUNGS TWICE A DAY 60 each 3   No current facility-administered medications for this visit.     Musculoskeletal: Strength & Muscle Tone: within normal limits Gait & Station: normal Patient leans: N/A  Psychiatric Specialty Exam: Review of Systems  Constitutional: Negative.   HENT: Negative.    Eyes: Negative.   Respiratory: Negative.    Cardiovascular: Negative.   Gastrointestinal: Negative.   Endocrine: Negative.   Genitourinary: Negative.   Musculoskeletal: Negative.   Skin: Negative.   Allergic/Immunologic: Negative.   Neurological: Negative.   Hematological: Negative.   Psychiatric/Behavioral:  The patient is nervous/anxious.     Blood pressure (!) 148/86, pulse 74, temperature 98.4 F (36.9 C), temperature source Temporal, height 5' 5 (1.651 m), weight 192 lb (87.1 kg), SpO2 97%.Body mass index is 31.95 kg/m.  General Appearance: Well Groomed  Eye Contact:  Good  Speech:  Clear and Coherent  Volume:  Normal  Mood:  Anxious  Affect:  Appropriate  Thought Process:  Coherent  Orientation:  Full (Time, Place, and Person)  Thought Content: Logical   Suicidal Thoughts:  No  Homicidal Thoughts:  No  Memory:  Immediate;   Good Recent;   Good Remote;    Good  Judgement:  Good  Insight:  Good  Psychomotor Activity:  Normal  Concentration:  Concentration: Good and Attention Span: Good  Recall:  Good  Fund of Knowledge: Good  Language: Good  Akathisia:  No  Handed:  Right  AIMS (if indicated):   Assets:  Desire for Improvement Financial Resources/Insurance Housing  ADL's:  Intact  Cognition: WNL  Sleep:  Good   Screenings: GAD-7    Flowsheet Row Office Visit from 11/22/2023 in [facility] Office Visit from 08/30/2023 in [facility] [place] Office Visit from 06/26/2023 in [facility]  Total GAD-7 Score 18 17 17    PHQ2-9    Flowsheet Row Office Visit from 11/22/2023 in [facility] Office Visit from 10/19/2023 in [facility] Office Visit from 08/30/2023 in [facility] Visit from 06/26/2023 in [facility] Total Score 6 6 4 6   PHQ-9 Total Score 20 21 16 18    Flowsheet Row Admission (Discharged) from 12/28/2023 in [facility] Office Visit from 11/22/2023 in [facility] Office Visit from 10/19/2023 in [facility]  C-SSRS RISK CATEGORY Error: Question 6 not populated No Risk No Risk     Assessment and Plan:  Assessment - Diagnosis: Bipolar 2 disorder (HCC) [F31.81]  Complicated grieving [F43.21]  Insomnia due to other mental disorder [F51.05, F99]   Differential Diagnosis: PTSD  - Progress: Baseline appointment - Risk Factors: Worsening symptoms  Plan - Medications:  Continue taking Lamictal  200mg  once a day, prescribed by PCP, patient has been educated on rashes to monitor for new rashes and to notify the clinic should she develop a rash and to stop medication.  Continue Abilify  5mg , patient has  been educated on tardive dyskinesia and abnormal movements and to monitor for symptoms, if this occurs to stop the medication and contact provider. Continue 25mg  Hydroxyzine  once daily before bed as needed for sleep - Psychotherapy:  No referrals at this time.   - Education: Pt has been educated on medication purpose, side effect, and adverse reactions.   Pt has been educated on grieving and the recommendation for faith based community involvement and the benefits of therapy.  Pt has been educated on importance of medication compliance with bipolar type 2.   - Follow-Up: Pt will follow up in 2 weeks. - Referrals: No referrals at this time.  - Safety Planning:  The patient has been educated, if they should have suicidal thoughts with or without a plan to call 911, or go to the closest emergency department.  Pt verbalized understanding.  Pt denies firearms within the home.  Pt also agrees to call the clinic should they have worsening symptoms before the next appointment.   Patient/Guardian was advised Release of Information must be obtained prior to any record release in order to collaborate their care with an outside provider. Patient/Guardian was advised if they have not already done so to contact the registration department to sign all necessary forms in order for us  to release information regarding their care.   Consent: Patient/Guardian gives verbal consent for treatment and assignment of benefits for services provided during this visit. Patient/Guardian expressed understanding and agreed to proceed.    Dorn Jama Der, NP 02/14/2024, 8:02 AM

## 2024-02-22 ENCOUNTER — Other Ambulatory Visit: Payer: Self-pay | Admitting: Family Medicine

## 2024-02-22 DIAGNOSIS — I10 Essential (primary) hypertension: Secondary | ICD-10-CM

## 2024-02-28 ENCOUNTER — Ambulatory Visit: Admitting: Psychiatry

## 2024-03-04 ENCOUNTER — Other Ambulatory Visit: Payer: Self-pay | Admitting: Psychiatry

## 2024-03-04 ENCOUNTER — Telehealth: Payer: Self-pay

## 2024-03-04 MED ORDER — ARIPIPRAZOLE 5 MG PO TABS: 5.0000 mg | ORAL_TABLET | Freq: Every day | ORAL | 2 refills | Status: DC

## 2024-03-04 NOTE — Telephone Encounter (Signed)
 Received a fax from patients pharmacy requesting a 90 day supply for the ARIPiprazole  (ABILIFY ) 5 MG tablet    Last visit 02-14-24 Next visit 03-06-24    Preferred Pharmacies   [id number] [place], [place] - [address] Phone: [phone]  Fax: [phone]

## 2024-03-06 ENCOUNTER — Ambulatory Visit: Admitting: Psychiatry

## 2024-03-27 ENCOUNTER — Ambulatory Visit: Admitting: Psychiatry

## 2024-03-28 ENCOUNTER — Telehealth: Payer: Self-pay | Admitting: Family Medicine

## 2024-03-28 NOTE — Telephone Encounter (Signed)
 Copied from CRM [id number]. Topic: Clinical - Order For Equipment >> Mar 28, 2024 12:26 PM Delon DASEN wrote: Reason for CRM: need prescription for mobility scooter- [phone]

## 2024-04-03 ENCOUNTER — Encounter: Payer: Self-pay | Admitting: Psychiatry

## 2024-04-03 ENCOUNTER — Other Ambulatory Visit: Payer: Self-pay

## 2024-04-03 ENCOUNTER — Ambulatory Visit (INDEPENDENT_AMBULATORY_CARE_PROVIDER_SITE_OTHER): Admitting: Psychiatry

## 2024-04-03 VITALS — BP 157/83 | HR 68 | Temp 97.4°F | Ht 65.0 in | Wt 199.2 lb

## 2024-04-03 DIAGNOSIS — F99 Mental disorder, not otherwise specified: Secondary | ICD-10-CM | POA: Diagnosis not present

## 2024-04-03 DIAGNOSIS — F5105 Insomnia due to other mental disorder: Secondary | ICD-10-CM | POA: Diagnosis not present

## 2024-04-03 DIAGNOSIS — F3175 Bipolar disorder, in partial remission, most recent episode depressed: Secondary | ICD-10-CM

## 2024-04-03 MED ORDER — ARIPIPRAZOLE 10 MG PO TABS: 10.0000 mg | ORAL_TABLET | Freq: Every day | ORAL | 2 refills | Status: DC

## 2024-04-03 NOTE — Progress Notes (Signed)
 BH MD/PA/NP OP Progress Note  04/03/2024 8:49 AM Cindy Grant  MRN:  [id number]  Chief Complaint:  Chief Complaint  Patient presents with   Follow-up   HPI: 58 year old female presenting [facility] for follow-up.  Patient reports that she has had a great week stating that she was traveling with her family had to the beach and went to [facility] for the weekend and had a lot of food which may or may not have contain a lot of sodium.  Patient reports blood pressure was elevated this week but states that most likely the food was the culprit as she knows that she has a more food with sodium than more than usual.  Patient appears in a good mood stating that she is happy with the current medication regimen stating that the only complaint is irritability.  Patient reports that she still lives with her kids and helping taking care of the kids but is still getting agitated and irritated and wants to explore possibly increasing Abilify .  Patient reports that she had a recent event in which she was honoring her husband who has passed that had a birthday in September in which she honored him by hanging balloons on the tree they planted and his honor.  Patient states that all in all she is feeling great and she enjoys these visits to the provider and states that she is currently satisfied with the care she is receiving and stating she is making a lot of progress.  Patient reports she is planning on moving out but states that she has a 49-month timeline in order to be able to save up enough money so she can move into apartment by herself stating that she would enjoy the independence and freedom.  Based on this assessment review is recommended for the patient increase Abilify  to 10 mg once daily.  Patient also to continue Lamictal  200 mg once daily.  Patient also to continue hydroxyzine  25 mg once a day before bed.  Patient has been educated on the use of magnesium glycinate and has been recommended to try magnesium  glycinate and lieu of hydroxyzine .  Patient with no other questions or concerns.  Patient is agreement with treatment plan.  Patient denies SI, HI, AVH. Visit Diagnosis:    ICD-10-CM   1. Bipolar disorder, in partial remission, most recent episode depressed (HCC)  F31.75     2. Insomnia due to other mental disorder  F51.05    F99       Past Psychiatric History:  Previous Psych Hospitalizations: Denies   Outpatient treatment: Psychotropic medications started by PCP   Medications Current: - Abilify  10mg  once day - Lamictal  200 mg once a day -Hydroxyzine  25 mg once a day before bed as needed for sleep   Medication Trials: - 2025, Duloxetine, poor response   Suicide & Violence: - Denies SI, HI, AVH.  Denies having a firearm in the home.   Psychotherapy: - Patient recommended for psychotherapy for complicated grieving and has been referred to [facility] [employer].    Legal: - denies  Past Medical History:  Past Medical History:  Diagnosis Date   Anxiety    Arthritis    Asthma    COPD (chronic obstructive pulmonary disease) (HCC)    Depression    Hypertension    Hypothyroidism    Nicotine dependence    Smoked 30 years +.  Agrees to lung cancer screening.   Recurrent falls    Stroke [facility])  Thyroid disease    Vertebrobasilar artery syndrome     Past Surgical History:  Procedure Laterality Date   ABDOMINAL HYSTERECTOMY     COLONOSCOPY N/A 11/09/2023   Procedure: COLONOSCOPY;  Surgeon: Jinny Carmine, MD;  Location: [facility] ENDOSCOPY;  Service: Endoscopy;  Laterality: N/A;   COLONOSCOPY N/A 12/28/2023   Procedure: COLONOSCOPY;  Surgeon: Jinny Carmine, MD;  Location: [facility] ENDOSCOPY;  Service: Endoscopy;  Laterality: N/A;   COMPLEX REPAIR EARS     KNEE SURGERY Left    POLYPECTOMY  12/28/2023   Procedure: POLYPECTOMY, INTESTINE;  Surgeon: Jinny Carmine, MD;  Location: [facility] ENDOSCOPY;  Service: Endoscopy;;   SHOULDER ARTHROSCOPY W/ ACROMIAL REPAIR      TONSILLECTOMY      Family Psychiatric History: No additional  Family History: History reviewed. No pertinent family history.  Social History:  Social History   Socioeconomic History   Marital status: Widowed    Spouse name: Not on file   Number of children: 2   Years of education: Not on file   Highest education level: Some college, no degree  Occupational History   Not on file  Tobacco Use   Smoking status: Every Day    Current packs/day: 0.25    Types: Cigarettes, E-cigarettes    Passive exposure: Current   Smokeless tobacco: Never   Tobacco comments:    Vape - daily 02/08/2023  Vaping Use   Vaping status: Every Day   Substances: THC, Flavoring  Substance and Sexual Activity   Alcohol use: Not Currently    Comment: occasionally   Drug use: Yes    Types: Marijuana    Comment: thc   Sexual activity: Not Currently  Other Topics Concern   Not on file  Social History Narrative   Not on file   Social Drivers of Health   Financial Resource Strain: Not on file  Food Insecurity: Not on file  Transportation Needs: Not on file  Physical Activity: Not on file  Stress: Not on file  Social Connections: Not on file    Allergies:  Allergies  Allergen Reactions   Hydrocodone Itching   Oxycodone Itching   Penicillins Hives    Metabolic Disorder Labs: No results found for: HGBA1C, MPG No results found for: PROLACTIN No results found for: CHOL, TRIG, HDL, CHOLHDL, VLDL, LDLCALC No results found for: TSH  Therapeutic Level Labs: No results found for: LITHIUM No results found for: VALPROATE No results found for: CBMZ  Current Medications: Current Outpatient Medications  Medication Sig Dispense Refill   ARIPiprazole  (ABILIFY ) 5 MG tablet Take 1 tablet (5 mg total) by mouth daily. 30 tablet 2   Blood Pressure Monitoring (BLOOD PRESSURE KIT) DEVI 1 each by Does not apply route daily. 1 each 0   cetirizine (ZYRTEC) 10 MG tablet Take 10 mg  by mouth daily.     hydrOXYzine  (ATARAX ) 25 MG tablet Take 1 tablet (25 mg total) by mouth at bedtime as needed. 90 tablet 0   lamoTRIgine  (LAMICTAL ) 100 MG tablet Take 2 tablets (200 mg total) by mouth daily. 60 tablet 2   levothyroxine  (SYNTHROID ) 150 MCG tablet Take 1 tablet (150 mcg total) by mouth daily. 90 tablet 3   losartan  (COZAAR ) 100 MG tablet TAKE 1 TABLET BY MOUTH EVERY DAY 90 tablet 1   montelukast (SINGULAIR) 10 MG tablet Take 10 mg by mouth at bedtime.     WIXELA INHUB 100-50 MCG/ACT AEPB INHALE 1 PUFF INTO THE LUNGS TWICE A DAY 60 each 3  No current facility-administered medications for this visit.   Blood pressure (!) 157/83, pulse 68, temperature (!) 97.4 F (36.3 C), temperature source Temporal, height 5' 5 (1.651 m), weight 199 lb 3.2 oz (90.4 kg).Body mass index is 33.15 kg/m.  Musculoskeletal: Strength & Muscle Tone: within normal limits Gait & Station: normal Patient leans: N/A   Psychiatric Specialty Exam: Review of Systems  Constitutional: Negative.   HENT: Negative.    Eyes: Negative.   Respiratory: Negative.    Cardiovascular: Negative.   Gastrointestinal: Negative.   Endocrine: Negative.   Genitourinary: Negative.   Musculoskeletal: Negative.   Skin: Negative.   Allergic/Immunologic: Negative.   Neurological: Negative.   Hematological: Negative.   Psychiatric/Behavioral:  The patient is nervous/anxious.     Blood pressure (!) 157/83, pulse 68, temperature (!) 97.4 F (36.3 C), temperature source Temporal, height 5' 5 (1.651 m), weight 199 lb 3.2 oz (90.4 kg).Body mass index is 33.15 kg/m.  General Appearance: Well Groomed  Eye Contact:  Good  Speech:  Clear and Coherent  Volume:  Normal  Mood:  Anxious  Affect:  Appropriate  Thought Process:  Coherent  Orientation:  Full (Time, Place, and Person)  Thought Content: Logical   Suicidal Thoughts:  No  Homicidal Thoughts:  No  Memory:  Immediate;   Good Recent;   Good Remote;   Good   Judgement:  Good  Insight:  Good  Psychomotor Activity:  Normal  Concentration:  Concentration: Good and Attention Span: Good  Recall:  Good  Fund of Knowledge: Good  Language: Good  Akathisia:  No  Handed:  Right  AIMS (if indicated):   Assets:  Desire for Improvement Financial Resources/Insurance Housing  ADL's:  Intact  Cognition: WNL  Sleep:  Good   Screenings: GAD-7    Flowsheet Row Office Visit from 11/22/2023 in [facility] Office Visit from 08/30/2023 in [facility] [place] Office Visit from 06/26/2023 in [facility]  Total GAD-7 Score 18 17 17    PHQ2-9    Flowsheet Row Office Visit from 11/22/2023 in [facility] Office Visit from 10/19/2023 in [facility] Office Visit from 08/30/2023 in [facility] [place] Office Visit from 06/26/2023 in [facility] Total Score 6 6 4 6   PHQ-9 Total Score 20 21 16 18    Flowsheet Row Admission (Discharged) from 12/28/2023 in [facility] Office Visit from 11/22/2023 in [facility] Office Visit from 10/19/2023 in [facility]  C-SSRS RISK CATEGORY Error: Question 6 not populated No Risk No Risk     Assessment and Plan:  Assessment - Diagnosis: Bipolar 2 disorder (HCC) [F31.81]  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  Increase Abilify  10mg , patient has been educated on tardive dyskinesia  and abnormal movements and to monitor for symptoms, if this occurs to stop the medication and contact provider. Continue 25mg  Hydroxyzine  once daily before bed as needed for sleep.  - Psychotherapy: No referrals at this time.   - Education:  Pt has been educated on medication purpose, side effect, and adverse reactions.   Pt has been educated on grieving and the recommendation for faith based community involvement and the benefits of therapy.  Pt has been educated on importance of medication compliance with bipolar type 2.   - Follow-Up: Pt will follow up in 2 weeks. - Referrals: No referrals at this time.  - Safety Planning:  The patient has been educated, if they should have suicidal thoughts with or without a plan to call 911, or go to the closest emergency department.  Pt verbalized understanding.  Pt denies firearms within the home.  Pt also agrees to call the clinic should they have worsening symptoms before the next appointment.     Patient/Guardian was advised Release of Information must be obtained prior to any record release in order to collaborate their care with an outside provider. Patient/Guardian was advised if they have not already done so to contact the registration department to sign all necessary forms in order for us  to release information regarding their care.   Consent: Patient/Guardian gives verbal consent for treatment and assignment of benefits for services provided during this visit. Patient/Guardian expressed understanding and agreed to proceed.    Dorn Jama Der, NP 04/03/2024, 8:49 AM

## 2024-04-10 ENCOUNTER — Ambulatory Visit: Admitting: Family Medicine

## 2024-04-16 ENCOUNTER — Telehealth: Payer: Self-pay

## 2024-04-16 NOTE — Telephone Encounter (Signed)
 Received fax from pharmacy requesting a refill of hydrOXYzine  (ATARAX ) 25 MG tablet    Last visit 04-03-24  Next visit 04-17-24     Preferred Pharmacies   [id number] [place], [place] - [address] Phone: [phone]  Fax: [phone]

## 2024-04-17 ENCOUNTER — Other Ambulatory Visit: Payer: Self-pay | Admitting: Psychiatry

## 2024-04-17 ENCOUNTER — Other Ambulatory Visit: Payer: Self-pay

## 2024-04-17 ENCOUNTER — Ambulatory Visit (INDEPENDENT_AMBULATORY_CARE_PROVIDER_SITE_OTHER): Admitting: Psychiatry

## 2024-04-17 ENCOUNTER — Ambulatory Visit: Admitting: Family Medicine

## 2024-04-17 ENCOUNTER — Encounter: Payer: Self-pay | Admitting: Psychiatry

## 2024-04-17 ENCOUNTER — Encounter: Payer: Self-pay | Admitting: Family Medicine

## 2024-04-17 VITALS — BP 153/84 | HR 71 | Temp 97.3°F | Ht 65.0 in | Wt 204.4 lb

## 2024-04-17 VITALS — BP 172/91 | HR 64 | Temp 97.5°F | Resp 20 | Ht 65.0 in | Wt 204.0 lb

## 2024-04-17 DIAGNOSIS — F3175 Bipolar disorder, in partial remission, most recent episode depressed: Secondary | ICD-10-CM

## 2024-04-17 DIAGNOSIS — F5105 Insomnia due to other mental disorder: Secondary | ICD-10-CM

## 2024-04-17 DIAGNOSIS — E039 Hypothyroidism, unspecified: Secondary | ICD-10-CM

## 2024-04-17 DIAGNOSIS — I10 Essential (primary) hypertension: Secondary | ICD-10-CM

## 2024-04-17 DIAGNOSIS — Z23 Encounter for immunization: Secondary | ICD-10-CM

## 2024-04-17 DIAGNOSIS — F99 Mental disorder, not otherwise specified: Secondary | ICD-10-CM

## 2024-04-17 DIAGNOSIS — J449 Chronic obstructive pulmonary disease, unspecified: Secondary | ICD-10-CM | POA: Diagnosis not present

## 2024-04-17 MED ORDER — LOSARTAN POTASSIUM-HCTZ 100-25 MG PO TABS: 1.0000 | ORAL_TABLET | Freq: Every day | ORAL | 1 refills | Status: AC

## 2024-04-17 MED ORDER — FLUTICASONE-SALMETEROL 100-50 MCG/ACT IN AEPB: 1.0000 | INHALATION_SPRAY | Freq: Two times a day (BID) | RESPIRATORY_TRACT | 5 refills | Status: AC

## 2024-04-17 MED ORDER — HYDROXYZINE HCL 25 MG PO TABS: 25.0000 mg | ORAL_TABLET | Freq: Every evening | ORAL | 0 refills | Status: AC | PRN

## 2024-04-17 MED ORDER — ALBUTEROL SULFATE HFA 108 (90 BASE) MCG/ACT IN AERS: 2.0000 | INHALATION_SPRAY | Freq: Four times a day (QID) | RESPIRATORY_TRACT | 3 refills | Status: AC | PRN

## 2024-04-17 NOTE — Assessment & Plan Note (Signed)
 Checking her TSH and free T4

## 2024-04-17 NOTE — Progress Notes (Signed)
 [facility] MD/PA/NP OP Progress Note  04/17/2024 8:39 AM Cindy Grant  MRN:  [id number]  Chief Complaint:  Chief Complaint  Patient presents with   Follow-up   HPI: 58 year old female presenting [facility] for follow-up.  Patient reports this week she lost her teeth, dentures, stating that she might of excellently thrown them away.  Patient reports this is a good amount of stress but she states that she is able to manage it and states that she is feeling much happier at home with her kids.  Patient reports she continues to be the primary caregiver for her grandkids and states that she is now able to be more patient as well as kind to them compared to previously.  Patient reports her current medication regimen is doing very well for her and she is actually feeling happy and stated that she took her kids to pumpkin patch to the previous day and had a great time.  Patient states she is, financially conscious at this time stating that $38 was a lot for her but she stated that her grandkids had a great time.  Patient reports currently she has plans for this season stating that she is planning to visit her family and reports that she is going to have to make accommodations for her teeth missing stating that she will have to eat foods that are soft and states that she is going to try to make her own food to avoid processed and canned foods that are high in sodium.  Patient reports she has not cut back smoking quite a bit due to the fact that she is trying to help manage her asthma better.  Patient reports that she is currently happy with the medication regimen is stating she is on a up cycle and reports that she is feeling great and is satisfied with current care she is receiving from this office.  Patient with no other questions or concerns at this time.  Patient is in agreement with treatment plan.  Patient will continue current medication regimen with no changes.  See plan.  Patient denies SI, HI, AVH.  Patient will  follow-up in 2 weeks. Visit Diagnosis:    ICD-10-CM   1. Bipolar disorder, in partial remission, most recent episode depressed (HCC)  F31.75     2. Insomnia due to other mental disorder  F51.05    F99       Past Psychiatric History:  Previous Psych Hospitalizations: Denies   Outpatient treatment: Psychotropic medications started by PCP   Medications Current: - Abilify  10mg  once day - Lamictal  200 mg once a day -Hydroxyzine  25 mg once a day before bed as needed for sleep   Medication Trials: - 2025, Duloxetine, poor response   Suicide & Violence: - Denies SI, HI, AVH.  Denies having a firearm in the home.   Psychotherapy: - Patient recommended for psychotherapy for complicated grieving and has been referred to [facility] [employer].    Legal: - denies  Past Medical History:  Past Medical History:  Diagnosis Date   Anxiety    Arthritis    Asthma    COPD (chronic obstructive pulmonary disease) (HCC)    Depression    Hypertension    Hypothyroidism    Nicotine dependence    Smoked 30 years +.  Agrees to lung cancer screening.   Recurrent falls    Stroke [facility])    Thyroid disease    Vertebrobasilar artery syndrome     Past Surgical  History:  Procedure Laterality Date   ABDOMINAL HYSTERECTOMY     COLONOSCOPY N/A 11/09/2023   Procedure: COLONOSCOPY;  Surgeon: Jinny Carmine, MD;  Location: [facility] ENDOSCOPY;  Service: Endoscopy;  Laterality: N/A;   COLONOSCOPY N/A 12/28/2023   Procedure: COLONOSCOPY;  Surgeon: Jinny Carmine, MD;  Location: [facility] ENDOSCOPY;  Service: Endoscopy;  Laterality: N/A;   COMPLEX REPAIR EARS     KNEE SURGERY Left    POLYPECTOMY  12/28/2023   Procedure: POLYPECTOMY, INTESTINE;  Surgeon: Jinny Carmine, MD;  Location: [facility] ENDOSCOPY;  Service: Endoscopy;;   SHOULDER ARTHROSCOPY W/ ACROMIAL REPAIR     TONSILLECTOMY      Family Psychiatric History: No additional  Family History: History reviewed. No pertinent family  history.  Social History:  Social History   Socioeconomic History   Marital status: Widowed    Spouse name: Not on file   Number of children: 2   Years of education: Not on file   Highest education level: 12th grade  Occupational History   Not on file  Tobacco Use   Smoking status: Every Day    Current packs/day: 0.25    Types: Cigarettes, E-cigarettes    Passive exposure: Current   Smokeless tobacco: Never   Tobacco comments:    Vape - daily 02/08/2023  Vaping Use   Vaping status: Every Day   Substances: THC, Flavoring  Substance and Sexual Activity   Alcohol use: Not Currently    Comment: occasionally   Drug use: Yes    Types: Marijuana    Comment: thc   Sexual activity: Not Currently  Other Topics Concern   Not on file  Social History Narrative   Not on file   Social Drivers of Health   Financial Resource Strain: Medium Risk (04/13/2024)   Overall Financial Resource Strain (CARDIA)    Difficulty of Paying Living Expenses: Somewhat hard  Food Insecurity: No Food Insecurity (04/13/2024)   Hunger Vital Sign    Worried About Running Out of Food in the Last Year: Never true    Ran Out of Food in the Last Year: Never true  Transportation Needs: No Transportation Needs (04/13/2024)   PRAPARE - Administrator, Civil Service (Medical): No    Lack of Transportation (Non-Medical): No  Physical Activity: Inactive (04/13/2024)   Exercise Vital Sign    Days of Exercise per Week: 0 days    Minutes of Exercise per Session: Not on file  Stress: Stress Concern Present (04/13/2024)   [employer] of Occupational Health - Occupational Stress Questionnaire    Feeling of Stress: Very much  Social Connections: Socially Isolated (04/13/2024)   Social Connection and Isolation Panel    Frequency of Communication with Friends and Family: Twice a week    Frequency of Social Gatherings with Friends and Family: More than three times a week    Attends Religious  Services: Never    Database administrator or Organizations: No    Attends Banker Meetings: Not on file    Marital Status: Widowed    Allergies:  Allergies  Allergen Reactions   Hydrocodone Itching   Oxycodone Itching   Penicillins Hives    Metabolic Disorder Labs: No results found for: HGBA1C, MPG No results found for: PROLACTIN No results found for: CHOL, TRIG, HDL, CHOLHDL, VLDL, LDLCALC No results found for: TSH  Therapeutic Level Labs: No results found for: LITHIUM No results found for: VALPROATE No results found for: CBMZ  Current Medications: Current Outpatient  Medications  Medication Sig Dispense Refill   ARIPiprazole  (ABILIFY ) 10 MG tablet Take 1 tablet (10 mg total) by mouth daily. 30 tablet 2   Blood Pressure Monitoring (BLOOD PRESSURE KIT) DEVI 1 each by Does not apply route daily. 1 each 0   cetirizine (ZYRTEC) 10 MG tablet Take 10 mg by mouth daily.     hydrOXYzine  (ATARAX ) 25 MG tablet Take 1 tablet (25 mg total) by mouth at bedtime as needed. 90 tablet 0   lamoTRIgine  (LAMICTAL ) 100 MG tablet Take 2 tablets (200 mg total) by mouth daily. 60 tablet 2   levothyroxine  (SYNTHROID ) 150 MCG tablet Take 1 tablet (150 mcg total) by mouth daily. 90 tablet 3   losartan  (COZAAR ) 100 MG tablet TAKE 1 TABLET BY MOUTH EVERY DAY 90 tablet 1   montelukast (SINGULAIR) 10 MG tablet Take 10 mg by mouth at bedtime.     WIXELA INHUB 100-50 MCG/ACT AEPB INHALE 1 PUFF INTO THE LUNGS TWICE A DAY 60 each 3   No current facility-administered medications for this visit.   Musculoskeletal: Strength & Muscle Tone: within normal limits Gait & Station: normal Patient leans: N/A   Psychiatric Specialty Exam: Review of Systems  Constitutional: Negative.   HENT: Negative.    Eyes: Negative.   Respiratory: Negative.    Cardiovascular: Negative.   Gastrointestinal: Negative.   Endocrine: Negative.   Genitourinary: Negative.   Musculoskeletal:  Negative.   Skin: Negative.   Allergic/Immunologic: Negative.   Neurological: Negative.   Hematological: Negative.   Psychiatric/Behavioral:  The patient is nervous/anxious.      Blood pressure (!) 153/84, pulse 71, temperature (!) 97.3 F (36.3 C), temperature source Temporal, height 5' 5 (1.651 m), weight 204 lb 6.4 oz (92.7 kg).Body mass index is 34.01 kg/m.   General Appearance: Well Groomed  Eye Contact:  Good  Speech:  Clear and Coherent  Volume:  Normal  Mood:  Anxious  Affect:  Appropriate  Thought Process:  Coherent  Orientation:  Full (Time, Place, and Person)  Thought Content: Logical   Suicidal Thoughts:  No  Homicidal Thoughts:  No  Memory:  Immediate;   Good Recent;   Good Remote;   Good  Judgement:  Good  Insight:  Good  Psychomotor Activity:  Normal  Concentration:  Concentration: Good and Attention Span: Good  Recall:  Good  Fund of Knowledge: Good  Language: Good  Akathisia:  No  Handed:  Right  AIMS (if indicated):   Assets:  Desire for Improvement Financial Resources/Insurance Housing  ADL's:  Intact  Cognition: WNL  Sleep:  Good   Screenings: GAD-7    Flowsheet Row Office Visit from 11/22/2023 in [facility] Office Visit from 08/30/2023 in [facility] at [facility] Visit from 06/26/2023 in [facility] at [facility]  Total GAD-7 Score 18 17 17    PHQ2-9    Flowsheet Row Office Visit from 11/22/2023 in [facility] Office Visit from 10/19/2023 in [facility] Office Visit from 08/30/2023 in [facility] Visit from 06/26/2023 in [facility] at [facility] Total Score 6 6 4 6   PHQ-9 Total Score 20 21 16 18    Flowsheet Row Admission (Discharged) from 12/28/2023 in [facility] Office Visit from 11/22/2023 in [facility] Office Visit from 10/19/2023 in [facility]  C-SSRS  RISK CATEGORY Error: Question 6 not populated No Risk No Risk     Assessment and Plan:  Assessment - Diagnosis: Bipolar 2 disorder (HCC) [F31.81]  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  Increase Abilify  10mg , patient has been educated on tardive dyskinesia and abnormal movements and to monitor for symptoms, if this occurs to stop the medication and contact provider. Continue 25mg  Hydroxyzine  once daily before bed as needed for sleep.  - Psychotherapy: No referrals at this time.   - Education: Pt has been educated on medication purpose, side effect, and adverse reactions.   Pt has been educated on grieving and the recommendation for faith based community involvement and the benefits of therapy.  Pt has been educated on importance of medication compliance with bipolar type 2.   - Follow-Up: Pt will follow up in 2 weeks. - Referrals: No referrals at this time.  - Safety Planning:  The patient has been educated, if they should have suicidal thoughts with or without a plan to call 911, or go to the closest emergency department.  Pt verbalized understanding.  Pt denies firearms within the home.  Pt also agrees to call the clinic should they have worsening symptoms before the next appointment.    Patient/Guardian was advised Release of Information must be obtained prior to any record release in order to collaborate their care with an outside provider. Patient/Guardian was advised if they have not already done so to contact the registration  department to sign all necessary forms in order for us  to release information regarding their care.   Consent: Patient/Guardian gives verbal consent for treatment and assignment of benefits for services provided during this visit. Patient/Guardian expressed understanding and agreed to proceed.    Dorn Jama Der, NP 04/17/2024, 8:39 AM

## 2024-04-17 NOTE — Assessment & Plan Note (Signed)
 Losartan  50 mg daily and her blood pressures run in the 170s over 90s.  Will change this to losartan /HCTZ 100/25 mg.  Please return in a month for recheck of your blood pressure and to check your renal function.  Please check your blood pressures at home and record them on the log we gave you.

## 2024-04-17 NOTE — Assessment & Plan Note (Signed)
 Prescription for albuterol and refilled her generic Advair.

## 2024-04-17 NOTE — Progress Notes (Unsigned)
 Established Patient Office Visit  Subjective   Patient ID: Cindy Grant, female    DOB: December 22, 1965  Age: 58 y.o. MRN: [id number]  Chief Complaint  Patient presents with   Medical Management of Chronic Issues    HPI Discussed the use of AI scribe software for clinical note transcription with the patient, who gave verbal consent to proceed.  History of Present Illness   Cindy Grant is a 58 year old female with hypertension who presents for blood pressure management.  She has a history of hypertension and is currently taking losartan . She recently acquired a blood pressure machine but has not yet used it. She is attempting to reduce her salt intake, although it is challenging due to her living situation with her son and his family. She has been experiencing significant swelling, particularly after a recent trip to the beach where she consumed a lot of seafood and fried foods. She notes weight gain and swelling since returning from the trip.  She has a history of depression and anxiety and is currently under the care of a psychiatrist, Dr. Bertis, who prescribes her medications. She is no longer taking Paxil and is now on Abilify  and another medication that starts with 'A', which was recently increased. She reports feeling better.  She has a history of smoking but quit three weeks ago and is currently vaping, although she is unsure of the nicotine content. She experienced a severe episode of respiratory distress recently, which led her to quit smoking. She has been using Primatine mist for her COPD>  She does not have albuterol.  She also has generic Advair.    She reports significant pain in her hips, lower back, and ankles, which she attributes partly to recent weight gain. This pain affects her ability to walk and participate in activities with her family, such as going to the grocery store or walking on the beach. She has not engaged in physical therapy recently.  She has not had a  flu vaccine this year and believes she may have already had the flu, describing a severe episode where she struggled to breathe and sought over-the-counter relief at a pharmacy.   She has not had any blood work in 3 years.  She is willing to get labs but she has eaten today already.          Objective:     BP (!) 172/91 (BP Location: Left Arm, Patient Position: Sitting, Cuff Size: Normal)   Pulse 64   Temp (!) 97.5 F (36.4 C) (Oral)   Resp 20   Ht 5' 5 (1.651 m)   Wt 204 lb (92.5 kg)   SpO2 96%   BMI 33.95 kg/m    Physical Exam Vitals and nursing note reviewed.  Constitutional:      Appearance: Normal appearance.  HENT:     Head: Normocephalic and atraumatic.  Eyes:     Conjunctiva/sclera: Conjunctivae normal.  Cardiovascular:     Rate and Rhythm: Normal rate and regular rhythm.  Pulmonary:     Effort: Pulmonary effort is normal.     Breath sounds: Normal breath sounds.  Musculoskeletal:     Right lower leg: No edema.     Left lower leg: No edema.  Skin:    General: Skin is warm and dry.  Neurological:     Mental Status: She is alert and oriented to person, place, and time.  Psychiatric:        Mood and Affect: Mood  normal.        Behavior: Behavior normal.        Thought Content: Thought content normal.        Judgment: Judgment normal.   She is on      No results found for any visits on 04/17/24.    The ASCVD Risk score (Arnett DK, et al., 2019) failed to calculate for the following reasons:   Risk score cannot be calculated because patient has a medical history suggesting prior/existing ASCVD    Assessment & Plan:  Essential hypertension Assessment & Plan: Losartan  50 mg daily and her blood pressures run in the 170s over 90s.  Will change this to losartan /HCTZ 100/25 mg.  Please return in a month for recheck of your blood pressure and to check your renal function.  Please check your blood pressures at home and record them on the log we gave  you.  Orders: -     CBC with Differential/Platelet; Future -     Comprehensive metabolic panel with GFR; Future -     Lipid panel; Future -     TSH; Future -     T4, free; Future -     Losartan  Potassium-HCTZ; Take 1 tablet by mouth daily.  Dispense: 90 tablet; Refill: 1  Immunization due -     Flu vaccine trivalent PF, 6mos and older(Flulaval,Afluria,Fluarix,Fluzone)  Acquired hypothyroidism Assessment & Plan: Checking her TSH and free T4.   COPD mixed type [facility]) Assessment & Plan: Prescription for albuterol and refilled her generic Advair.  Orders: -     Albuterol Sulfate HFA; Inhale 2 puffs into the lungs every 6 (six) hours as needed for wheezing or shortness of breath.  Dispense: 8 g; Refill: 3 -     Fluticasone -Salmeterol; Inhale 1 puff into the lungs 2 (two) times daily.  Dispense: 60 each; Refill: 5        Assessment and Plan    Lower back and hip pain with impaired mobility Chronic lower back and hip pain with impaired mobility, likely exacerbated by recent weight gain. Pain is constant, burning, and affects daily activities and participation in family events. - Refer to physical therapy for evaluation and management - Consider referral to a specialist if physical therapy is ineffective  Depression Depression with recent medication adjustment. Currently on Abilify  and another medication. Reports improvement in symptoms since the adjustment. - Continue current psychiatric medications as prescribed by her psychiatrist - Follow up with psychiatrist for ongoing management  Obesity with recent weight gain Recent weight gain, possibly related to dietary habits and cessation of smoking. Contributing to lower extremity edema and exacerbating lower back and hip pain. - Encourage dietary modifications to support weight loss - Monitor weight and discuss progress in future visits  History of tobacco use, currently vaping History of tobacco use with recent cessation of  smoking. Transitioned to vaping, perceived as less harmful. Reports reduced coughing and improved breathing since switching to vaping. - Discuss potential risks of vaping and encourage complete cessation of nicotine use   Return in about 4 weeks (around 05/15/2024) for BP recheck.    Soyla Bainter K Manasa Spease, MD

## 2024-04-29 ENCOUNTER — Telehealth: Payer: Self-pay

## 2024-04-29 NOTE — Telephone Encounter (Signed)
 Received fax from patients pharmacy requesting a refill of lamoTRIgine  (LAMICTAL ) 100 MG tablet please advise  Last visit 04-17-24 Next visit 05-01-24    Preferred Pharmacies   [id number] [place], [place] - [address] Phone: [phone]  Fax: [phone]

## 2024-04-30 ENCOUNTER — Other Ambulatory Visit: Payer: Self-pay | Admitting: Psychiatry

## 2024-04-30 DIAGNOSIS — F3181 Bipolar II disorder: Secondary | ICD-10-CM

## 2024-04-30 MED ORDER — LAMOTRIGINE 100 MG PO TABS: 200.0000 mg | ORAL_TABLET | Freq: Every day | ORAL | 2 refills | Status: AC

## 2024-05-01 ENCOUNTER — Ambulatory Visit: Admitting: Psychiatry

## 2024-05-07 ENCOUNTER — Telehealth: Payer: Self-pay

## 2024-05-07 NOTE — Telephone Encounter (Signed)
 Medication refill - [facility] new Aripiprazole  10 mg 90 day order be sent in for patient, last e-scribed a 30 day order + 2 refills on 07/05/23. Patient no showed 05/01/24 and has not rescheduled.

## 2024-05-08 ENCOUNTER — Other Ambulatory Visit: Payer: Self-pay | Admitting: Psychiatry

## 2024-05-08 MED ORDER — ARIPIPRAZOLE 10 MG PO TABS: 10.0000 mg | ORAL_TABLET | Freq: Every day | ORAL | 1 refills | Status: AC

## 2024-05-09 ENCOUNTER — Ambulatory Visit

## 2024-05-09 VITALS — Ht 65.0 in | Wt 204.0 lb

## 2024-05-09 DIAGNOSIS — Z0000 Encounter for general adult medical examination without abnormal findings: Secondary | ICD-10-CM | POA: Diagnosis not present

## 2024-05-09 NOTE — Patient Instructions (Signed)
 Ms. Cindy Grant,  Thank you for taking the time for your Medicare Wellness Visit. I appreciate your continued commitment to your health goals. Please review the care plan we discussed, and feel free to reach out if I can assist you further.  Please note that Annual Wellness Visits do not include a physical exam. Some assessments may be limited, especially if the visit was conducted virtually. If needed, we may recommend an in-person follow-up with your provider.  Ongoing Care Seeing your primary care provider every 3 to 6 months helps us  monitor your health and provide consistent, personalized care.   Referrals If a referral was made during today's visit and you haven't received any updates within two weeks, please contact the referred provider directly to check on the status.  Recommended Screenings:  Health Maintenance  Topic Date Due   HIV Screening  Never done   Hepatitis C Screening  Never done   Pap with HPV screening  Never done   Breast Cancer Screening  Never done   Zoster (Shingles) Vaccine (1 of 2) Never done   Screening for Lung Cancer  09/29/2016   Pneumococcal Vaccine for age over 78 (2 of 2 - PCV) 04/24/2018   COVID-19 Vaccine (2 - 2025-26 season) 02/26/2024   Medicare Annual Wellness Visit  05/09/2025   DTaP/Tdap/Td vaccine (2 - Td or Tdap) 02/11/2026   Colon Cancer Screening  12/27/2033   Flu Shot  Completed   Hepatitis B Vaccine  Aged Out   HPV Vaccine  Aged Out   Meningitis B Vaccine  Aged Out       05/08/2024    8:24 AM  Advanced Directives  Does Patient Have a Medical Advance Directive? No  Would patient like information on creating a medical advance directive? Yes (MAU/Ambulatory/Procedural Areas - Information given)   Information on Advanced Care Planning can be found at [place]  Secretary of [facility] Advance Health Care Directives Advance Health Care Directives ([email or web address]    Vision: Annual vision screenings are recommended for early detection of  glaucoma, cataracts, and diabetic retinopathy. These exams can also reveal signs of chronic conditions such as diabetes and high blood pressure.  Dental: Annual dental screenings help detect early signs of oral cancer, gum disease, and other conditions linked to overall health, including heart disease and diabetes.  Please see the attached documents for additional preventive care recommendations.

## 2024-05-09 NOTE — Progress Notes (Signed)
 Chief Complaint  Patient presents with   Medicare Wellness     Subjective:   Cindy Grant is a 58 y.o. female who presents for a Medicare Annual Wellness Visit.  Allergies (verified) Hydrocodone, Oxycodone, and Penicillins   History: Past Medical History:  Diagnosis Date   Anxiety    Arthritis    Asthma    COPD (chronic obstructive pulmonary disease) (HCC)    Depression    Hypertension    Hypothyroidism    Nicotine dependence    Smoked 30 years +.  Agrees to lung cancer screening.   Recurrent falls    Stroke [facility])    Thyroid disease    Vertebrobasilar artery syndrome    Past Surgical History:  Procedure Laterality Date   ABDOMINAL HYSTERECTOMY     COLONOSCOPY N/A 11/09/2023   Procedure: COLONOSCOPY;  Surgeon: Jinny Carmine, MD;  Location: [facility] ENDOSCOPY;  Service: Endoscopy;  Laterality: N/A;   COLONOSCOPY N/A 12/28/2023   Procedure: COLONOSCOPY;  Surgeon: Jinny Carmine, MD;  Location: [facility] ENDOSCOPY;  Service: Endoscopy;  Laterality: N/A;   COMPLEX REPAIR EARS     KNEE SURGERY Left    POLYPECTOMY  12/28/2023   Procedure: POLYPECTOMY, INTESTINE;  Surgeon: Jinny Carmine, MD;  Location: [facility] ENDOSCOPY;  Service: Endoscopy;;   SHOULDER ARTHROSCOPY W/ ACROMIAL REPAIR     TONSILLECTOMY     No family history on file. Social History   Occupational History   Not on file  Tobacco Use   Smoking status: Every Day    Current packs/day: 0.25    Types: Cigarettes, E-cigarettes    Passive exposure: Current   Smokeless tobacco: Never   Tobacco comments:    Vape - daily 02/08/2023  Vaping Use   Vaping status: Every Day   Substances: THC, Flavoring  Substance and Sexual Activity   Alcohol use: Not Currently    Comment: occasionally   Drug use: Yes    Types: Marijuana    Comment: thc   Sexual activity: Not Currently   Tobacco Counseling Ready to quit: Not Answered Counseling given: Not Answered Tobacco comments: Vape - daily 02/08/2023  SDOH Screenings   Food  Insecurity: No Food Insecurity (05/09/2024)  Housing: Low Risk  (05/09/2024)  Transportation Needs: No Transportation Needs (05/09/2024)  Utilities: Not At Risk (05/09/2024)  Depression (PHQ2-9): High Risk (11/22/2023)  Financial Resource Strain: Medium Risk (04/13/2024)  Physical Activity: Inactive (05/09/2024)  Social Connections: Socially Isolated (05/09/2024)  Stress: Stress Concern Present (05/09/2024)  Tobacco Use: High Risk (05/09/2024)  Health Literacy: Adequate Health Literacy (05/09/2024)   See flowsheets for full screening details  Depression Screen Depression Screening Exception Documentation Depression Screening Exception:: Patient refusal (followed by psychiatry)  PHQ 2 & 9 Depression Scale- Over the past 2 weeks, how often have you been bothered by any of the following problems? Little interest or pleasure in doing things: 3 Feeling down, depressed, or hopeless (PHQ Adolescent also includes...irritable): 3 PHQ-2 Total Score: 6 Trouble falling or staying asleep, or sleeping too much: 3 Feeling tired or having little energy: 3 Poor appetite or overeating (PHQ Adolescent also includes...weight loss): 2 Feeling bad about yourself - or that you are a failure or have let yourself or your family down: 3 Trouble concentrating on things, such as reading the newspaper or watching television (PHQ Adolescent also includes...like school work): 3 Moving or speaking so slowly that other people could have noticed. Or the opposite - being so fidgety or restless that you have been moving around a  lot more than usual: 0 Thoughts that you would be better off dead, or of hurting yourself in some way: 0 PHQ-9 Total Score: 20 If you checked off any problems, how difficult have these problems made it for you to do your work, take care of things at home, or get along with other people?: Very difficult  Depression Treatment Depression Interventions/Treatment : Medication; Currently on  Treatment     Goals Addressed             This Visit's Progress    Prevent falls   On track      Visit info / Clinical Intake: Medicare Wellness Visit Type:: Initial Annual Wellness Visit Persons participating in visit:: patient Medicare Wellness Visit Mode:: Telephone If telephone:: video declined Because this visit was a virtual/telehealth visit:: vitals recorded from last visit If Telephone or Video please confirm:: I connected with the patient using audio enabled telemedicine application and verified that I am speaking with the correct person using two identifiers; I discussed the limitations of evaluation and management by telemedicine; The patient expressed understanding and agreed to proceed Patient Location:: home Provider Location:: home office Information given by:: patient Interpreter Needed?: No Pre-visit prep was completed: yes AWV questionnaire completed by patient prior to visit?: yes Date:: 05/08/24 Living arrangements:: with family/others Patient's Overall Health Status Rating: good Typical amount of pain: some Does pain affect daily life?: no Are you currently prescribed opioids?: no  Dietary Habits and Nutritional Risks How many meals a day?: 3 Eats fruit and vegetables daily?: yes Most meals are obtained by: preparing own meals Diabetic:: no  Functional Status Activities of Daily Living (to include ambulation/medication): (!) (Patient-Rptd) Needs Assist Ambulation: (Patient-Rptd) Independent with device- listed below Medication Administration: Independent Home Management: Needs assistance (comment) Manage your own finances?: yes Primary transportation is: family/friends Concerns about vision?: no *vision screening is required for WTM* Concerns about hearing?: no  Fall Screening Falls in the past year?: (Patient-Rptd) 1 Number of falls in past year: (Patient-Rptd) 1 Was there an injury with Fall?: (Patient-Rptd) 1 Fall Risk Category Calculator:  (Patient-Rptd) 3 Patient Fall Risk Level: (Patient-Rptd) High Fall Risk  Fall Risk Patient at Risk for Falls Due to: History of fall(s); Impaired mobility; Impaired balance/gait Fall risk Follow up: Falls evaluation completed; Education provided; Falls prevention discussed  Home and Transportation Safety: All rugs have non-skid backing?: yes All stairs or steps have railings?: yes Grab bars in the bathtub or shower?: yes Have non-skid surface in bathtub or shower?: yes Good home lighting?: yes Regular seat belt use?: yes Hospital stays in the last year:: no  Cognitive Assessment Difficulty concentrating, remembering, or making decisions? : no Will 6CIT or Mini Cog be Completed: no 6CIT or Mini Cog Declined: patient alert, oriented, able to answer questions appropriately and recall recent events  Advance Directives (For Healthcare) Does Patient Have a Medical Advance Directive?: No Would patient like information on creating a medical advance directive?: Yes (MAU/Ambulatory/Procedural Areas - Information given)  Reviewed/Updated  Reviewed/Updated: Reviewed All (Medical, Surgical, Family, Medications, Allergies, Care Teams, Patient Goals)        Objective:    Today's Vitals   05/09/24 0816  Weight: 204 lb (92.5 kg)  Height: 5' 5 (1.651 m)   Body mass index is 33.95 kg/m.  Current Medications (verified) Outpatient Encounter Medications as of 05/09/2024  Medication Sig   albuterol (VENTOLIN HFA) 108 (90 Base) MCG/ACT inhaler Inhale 2 puffs into the lungs every 6 (six) hours as needed for  wheezing or shortness of breath.   ARIPiprazole  (ABILIFY ) 10 MG tablet Take 1 tablet (10 mg total) by mouth daily.   fluticasone -salmeterol (WIXELA INHUB) 100-50 MCG/ACT AEPB Inhale 1 puff into the lungs 2 (two) times daily.   hydrOXYzine  (ATARAX ) 25 MG tablet Take 1 tablet (25 mg total) by mouth at bedtime as needed.   lamoTRIgine  (LAMICTAL ) 100 MG tablet Take 2 tablets (200 mg total) by  mouth daily.   levothyroxine  (SYNTHROID ) 150 MCG tablet Take 1 tablet (150 mcg total) by mouth daily.   losartan  (COZAAR ) 100 MG tablet TAKE 1 TABLET BY MOUTH EVERY DAY   losartan -hydrochlorothiazide (HYZAAR) 100-25 MG tablet Take 1 tablet by mouth daily.   [DISCONTINUED] Blood Pressure Monitoring (BLOOD PRESSURE KIT) DEVI 1 each by Does not apply route daily.   [DISCONTINUED] cetirizine (ZYRTEC) 10 MG tablet Take 10 mg by mouth daily.   [DISCONTINUED] montelukast (SINGULAIR) 10 MG tablet Take 10 mg by mouth at bedtime.   No facility-administered encounter medications on file as of 05/09/2024.   Hearing/Vision screen Hearing Screening - Comments:: Patient is able to hear conversational tones without difficulty. No issues reported.   Vision Screening - Comments:: Wears rx glasses - up to date with routine eye exams with [facility]  Immunizations and Health Maintenance Health Maintenance  Topic Date Due   HIV Screening  Never done   Hepatitis C Screening  Never done   Cervical Cancer Screening (HPV/Pap Cotest)  Never done   Mammogram  Never done   Zoster Vaccines- Shingrix (1 of 2) Never done   Lung Cancer Screening  09/29/2016   Pneumococcal Vaccine: 50+ Years (2 of 2 - PCV) 04/24/2018   COVID-19 Vaccine (2 - 2025-26 season) 02/26/2024   Medicare Annual Wellness (AWV)  05/09/2025   DTaP/Tdap/Td (2 - Td or Tdap) 02/11/2026   Colonoscopy  12/27/2033   Influenza Vaccine  Completed   Hepatitis B Vaccines 19-59 Average Risk  Aged Out   HPV VACCINES  Aged Out   Meningococcal B Vaccine  Aged Out        Assessment/Plan:  This is a routine wellness examination for Quentina.  Patient Care Team: Ziglar, Susan K, MD as PCP - General (Family Medicine) Saucier, Dorn Ruth, NP as Nurse Practitioner (Psychiatry) [employer]  I have personally reviewed and noted the following in the patient's chart:   Medical and social history Use of alcohol, tobacco or illicit drugs   Current medications and supplements including opioid prescriptions. Functional ability and status Nutritional status Physical activity Advanced directives List of other physicians Hospitalizations, surgeries, and ER visits in previous 12 months Vitals Screenings to include cognitive, depression, and falls Referrals and appointments  No orders of the defined types were placed in this encounter.  In addition, I have reviewed and discussed with patient certain preventive protocols, quality metrics, and best practice recommendations. A written personalized care plan for preventive services as well as general preventive health recommendations were provided to patient.   Lavelle Charmaine Browner, LPN   88/86/7974   Return in 1 year (on 05/09/2025).  After Visit Summary: (MyChart) Due to this being a telephonic visit, the after visit summary with patients personalized plan was offered to patient via MyChart   Nurse Notes: Patient would like to update provider that she has not started home monitoring of blood pressure at this time

## 2024-05-15 ENCOUNTER — Ambulatory Visit: Admitting: Family Medicine

## 2024-06-25 ENCOUNTER — Other Ambulatory Visit: Payer: Self-pay

## 2024-06-25 ENCOUNTER — Ambulatory Visit (INDEPENDENT_AMBULATORY_CARE_PROVIDER_SITE_OTHER): Admitting: Psychiatry

## 2024-06-25 ENCOUNTER — Encounter: Payer: Self-pay | Admitting: Psychiatry

## 2024-06-25 VITALS — BP 132/83 | HR 64 | Temp 97.6°F | Ht 65.0 in | Wt 207.4 lb

## 2024-06-25 DIAGNOSIS — F3181 Bipolar II disorder: Secondary | ICD-10-CM

## 2024-06-25 DIAGNOSIS — F5105 Insomnia due to other mental disorder: Secondary | ICD-10-CM | POA: Diagnosis not present

## 2024-06-25 DIAGNOSIS — F99 Mental disorder, not otherwise specified: Secondary | ICD-10-CM | POA: Diagnosis not present

## 2024-06-25 DIAGNOSIS — F4381 Prolonged grief disorder: Secondary | ICD-10-CM | POA: Diagnosis not present

## 2024-06-25 NOTE — Progress Notes (Signed)
 [facility] MD/PA/NP OP Progress Note  06/25/2024 8:50 AM Cindy Grant  MRN:  [id number]  Chief Complaint:  Chief Complaint  Patient presents with   Follow-up   HPI: 58 year old female presenting to Ortho for follow-up.  Patient reports that she was looking forward to this visit as she had a rough holiday experience.  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  Patient reports now that she is feeling more anxious as due to the holiday season stating that she feels isolated and reports that she is not sure if she is ready to date.  She has had multiple conversations with her friend Cindy Grant and states that she is just not ready.  Patient reports that she has been questioning whether or not God is taking care of her and if she is honoring God stated that she is taking a big leap towards her being Faith focused in her life to walk.  Patient reports that she would like to try to get to church but states that she is not sure how she feels about it.  Patient reports that the medications are appropriate and she is doing well and stating that no adjustments need to be made at this time.  Based on this assessment reviews recommend for patient to focus on trying to go to church again so that she can get a sense of community and report of her need for community as well as social interactions.  Patient reports that she does enjoy social interactions and states she will be open to trying to go to a church.  No recommendation changes at this time with medications.  Patient states that medications are going well.  Patient with no other question concerns at this time.  Patient is in agreement with treatment plan.  Patient reports that she would like to follow this provider's practice therefore no follow-up needed. Visit  Diagnosis:    ICD-10-CM   1. Bipolar 2 disorder (HCC)  F31.81     2. Insomnia due to other mental disorder  F51.05    F99       Past Psychiatric History:  Previous Psych Hospitalizations: Denies   Outpatient treatment: Psychotropic medications started by PCP   Medications Current: - Abilify  10mg  once day - Lamictal  200 mg once a day -Hydroxyzine  25 mg once a day before bed as needed for sleep   Medication Trials: - 2025, Duloxetine, poor response   Suicide & Violence: - Denies SI, HI, AVH.  Denies having a firearm in the home.   Psychotherapy: - Patient recommended for psychotherapy for complicated grieving and has been referred to [facility] [employer].    Legal: - denies  Past Medical History:  Past Medical History:  Diagnosis Date   Anxiety    Arthritis    Asthma    COPD (chronic obstructive pulmonary disease) (HCC)    Depression    Hypertension    Hypothyroidism    Nicotine dependence    Smoked 30 years +.  Agrees to lung cancer screening.   Recurrent falls    Stroke [facility])    Thyroid disease    Vertebrobasilar artery syndrome     Past Surgical History:  Procedure Laterality Date   ABDOMINAL HYSTERECTOMY  COLONOSCOPY N/A 11/09/2023   Procedure: COLONOSCOPY;  Surgeon: Jinny Carmine, MD;  Location: [facility] ENDOSCOPY;  Service: Endoscopy;  Laterality: N/A;   COLONOSCOPY N/A 12/28/2023   Procedure: COLONOSCOPY;  Surgeon: Jinny Carmine, MD;  Location: [facility] ENDOSCOPY;  Service: Endoscopy;  Laterality: N/A;   COMPLEX REPAIR EARS     KNEE SURGERY Left    POLYPECTOMY  12/28/2023   Procedure: POLYPECTOMY, INTESTINE;  Surgeon: Jinny Carmine, MD;  Location: [facility] ENDOSCOPY;  Service: Endoscopy;;   SHOULDER ARTHROSCOPY W/ ACROMIAL REPAIR     TONSILLECTOMY      Family Psychiatric History: No additional  Family History: History reviewed. No pertinent family history.  Social History:  Social History   Socioeconomic History   Marital status: Widowed     Spouse name: Not on file   Number of children: 2   Years of education: Not on file   Highest education level: 12th grade  Occupational History   Not on file  Tobacco Use   Smoking status: Every Day    Current packs/day: 0.25    Types: Cigarettes, E-cigarettes    Passive exposure: Current   Smokeless tobacco: Never   Tobacco comments:    Vape - daily 02/08/2023  Vaping Use   Vaping status: Every Day   Substances: THC, Flavoring  Substance and Sexual Activity   Alcohol use: Not Currently    Comment: occasionally   Drug use: Yes    Types: Marijuana    Comment: thc   Sexual activity: Not Currently  Other Topics Concern   Not on file  Social History Narrative   Not on file   Social Drivers of Health   Tobacco Use: High Risk (06/25/2024)   Patient History    Smoking Tobacco Use: Every Day    Smokeless Tobacco Use: Never    Passive Exposure: Current  Financial Resource Strain: Medium Risk (04/13/2024)   Overall Financial Resource Strain (CARDIA)    Difficulty of Paying Living Expenses: Somewhat hard  Food Insecurity: No Food Insecurity (05/09/2024)   Epic    Worried About Programme Researcher, Broadcasting/film/video in the Last Year: Never true    Ran Out of Food in the Last Year: Never true  Transportation Needs: No Transportation Needs (05/09/2024)   Epic    Lack of Transportation (Medical): No    Lack of Transportation (Non-Medical): No  Physical Activity: Inactive (05/09/2024)   Exercise Vital Sign    Days of Exercise per Week: 0 days    Minutes of Exercise per Session: 0 min  Stress: Stress Concern Present (05/09/2024)   [employer] of Occupational Health - Occupational Stress Questionnaire    Feeling of Stress: Very much  Social Connections: Socially Isolated (05/09/2024)   Social Connection and Isolation Panel    Frequency of Communication with Friends and Family: Twice a week    Frequency of Social Gatherings with Friends and Family: More than three times a week    Attends  Religious Services: Never    Database Administrator or Organizations: No    Attends Banker Meetings: Never    Marital Status: Widowed  Depression (PHQ2-9): High Risk (11/22/2023)   Depression (PHQ2-9)    PHQ-2 Score: 20  Alcohol Screen: Not on file  Housing: Low Risk (05/09/2024)   Epic    Unable to Pay for Housing in the Last Year: No    Number of Times Moved in the Last Year: 0    Homeless in the Last Year: No  Utilities:  Not At Risk (05/09/2024)   Epic    Threatened with loss of utilities: No  Health Literacy: Adequate Health Literacy (05/09/2024)   B1300 Health Literacy    Frequency of need for help with medical instructions: Rarely    Allergies: Allergies[1]  Metabolic Disorder Labs: No results found for: HGBA1C, MPG No results found for: PROLACTIN No results found for: CHOL, TRIG, HDL, CHOLHDL, VLDL, LDLCALC No results found for: TSH  Therapeutic Level Labs: No results found for: LITHIUM No results found for: VALPROATE No results found for: CBMZ  Current Medications: Current Outpatient Medications  Medication Sig Dispense Refill   albuterol  (VENTOLIN  HFA) 108 (90 Base) MCG/ACT inhaler Inhale 2 puffs into the lungs every 6 (six) hours as needed for wheezing or shortness of breath. 8 g 3   ARIPiprazole  (ABILIFY ) 10 MG tablet Take 1 tablet (10 mg total) by mouth daily. 90 tablet 1   fluticasone -salmeterol (WIXELA INHUB) 100-50 MCG/ACT AEPB Inhale 1 puff into the lungs 2 (two) times daily. 60 each 5   hydrOXYzine  (ATARAX ) 25 MG tablet Take 1 tablet (25 mg total) by mouth at bedtime as needed. 90 tablet 0   lamoTRIgine  (LAMICTAL ) 100 MG tablet Take 2 tablets (200 mg total) by mouth daily. 60 tablet 2   levothyroxine  (SYNTHROID ) 150 MCG tablet Take 1 tablet (150 mcg total) by mouth daily. 90 tablet 3   losartan  (COZAAR ) 100 MG tablet TAKE 1 TABLET BY MOUTH EVERY DAY 90 tablet 1   losartan -hydrochlorothiazide (HYZAAR) 100-25 MG tablet  Take 1 tablet by mouth daily. 90 tablet 1   No current facility-administered medications for this visit.     Musculoskeletal: Strength & Muscle Tone: within normal limits Gait & Station: normal Patient leans: N/A   Psychiatric Specialty Exam: Review of Systems  Constitutional: Negative.   HENT: Negative.    Eyes: Negative.   Respiratory: Negative.    Cardiovascular: Negative.   Gastrointestinal: Negative.   Endocrine: Negative.   Genitourinary: Negative.   Musculoskeletal: Negative.   Skin: Negative.   Allergic/Immunologic: Negative.   Neurological: Negative.   Hematological: Negative.   Psychiatric/Behavioral:  The patient is nervous/anxious.      Blood pressure (!) 153/84, pulse 71, temperature (!) 97.3 F (36.3 C), temperature source Temporal, height 5' 5 (1.651 m), weight 204 lb 6.4 oz (92.7 kg).Body mass index is 34.01 kg/m.    General Appearance: Well Groomed  Eye Contact:  Good  Speech:  Clear and Coherent  Volume:  Normal  Mood:  Anxious  Affect:  Appropriate  Thought Process:  Coherent  Orientation:  Full (Time, Place, and Person)  Thought Content: Logical   Suicidal Thoughts:  No  Homicidal Thoughts:  No  Memory:  Immediate;   Good Recent;   Good Remote;   Good  Judgement:  Good  Insight:  Good  Psychomotor Activity:  Normal  Concentration:  Concentration: Good and Attention Span: Good  Recall:  Good  Fund of Knowledge: Good  Language: Good  Akathisia:  No  Handed:  Right  AIMS (if indicated):   Assets:  Desire for Improvement Financial Resources/Insurance Housing  ADL's:  Intact  Cognition: WNL  Sleep:  Good   Screenings: GAD-7    Flowsheet Row Office Visit from 11/22/2023 in [facility] Office Visit from 08/30/2023 in [facility] Visit from 06/26/2023 in [facility] at [facility]  Total GAD-7 Score 18 17 17    PHQ2-9    Flowsheet  Row Office Visit from  11/22/2023 in [facility] Office Visit from 10/19/2023 in [facility] Office Visit from 08/30/2023 in [facility] Visit from 06/26/2023 in [facility] Total Score 6 6 4 6   PHQ-9 Total Score 20 21 16 18    Flowsheet Row Admission (Discharged) from 12/28/2023 in [facility] Office Visit from 11/22/2023 in [facility] Office Visit from 10/19/2023 in [facility]  C-SSRS RISK CATEGORY Error: Question 6 not populated No Risk No Risk     Assessment and Plan:  Assessment - Diagnosis: Bipolar 2 disorder (HCC) [F31.81]  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  Increase Abilify  10mg , patient has been educated on tardive dyskinesia and abnormal movements and to monitor for symptoms, if this occurs to stop the medication and contact provider. Continue 25mg  Hydroxyzine  once daily before bed as needed for sleep.  - Psychotherapy: No referrals at this time.   - Education: Pt has been educated on medication purpose, side effect, and adverse reactions.   Pt has been educated on grieving and the recommendation for faith based community involvement and the benefits of therapy.  Pt has been educated on importance of medication compliance with bipolar type 2.   - Follow-Up: Pt will follow up in 2 weeks. - Referrals: No referrals at this time.  - Safety Planning:  The patient has been educated, if they should have suicidal thoughts with  or without a plan to call 911, or go to the closest emergency department.  Pt verbalized understanding.  Pt denies firearms within the home.  Pt also agrees to call the clinic should they have worsening symptoms before the next appointment.  Patient/Guardian was advised Release of Information must be obtained prior to any record release in order to collaborate their care with an outside provider. Patient/Guardian was advised if they have not already done so to contact the registration department to sign all necessary forms in order for us  to release information regarding their care.   Consent: Patient/Guardian gives verbal consent for treatment and assignment of benefits for services provided during this visit. Patient/Guardian expressed understanding and agreed to proceed.    Dorn Jama Der, NP 06/25/2024, 8:50 AM     [1]  Allergies Allergen Reactions   Hydrocodone Itching   Oxycodone Itching   Penicillins Hives
